# Patient Record
Sex: Male | Born: 1990 | Race: Black or African American | Hispanic: No | Marital: Single | Smoking: Never smoker
Health system: Southern US, Community
[De-identification: ages and names within clinical notes are randomized; demographics above are authoritative.]

## PROBLEM LIST (undated history)

## (undated) HISTORY — PX: TESTICLE SURGERY: SHX794

---

## 2015-06-30 ENCOUNTER — Inpatient Hospital Stay (HOSPITAL_COMMUNITY)
Admission: EM | Admit: 2015-06-30 | Discharge: 2015-07-04 | DRG: 885 | Disposition: A | Discharge status: Other Acute Inpatient Hospital | Payer: PRIVATE HEALTH INSURANCE | Attending: Internal Medicine | Admitting: Internal Medicine

## 2015-06-30 ENCOUNTER — Encounter (HOSPITAL_COMMUNITY): Payer: Self-pay

## 2015-06-30 DIAGNOSIS — Y92009 Unspecified place in unspecified non-institutional (private) residence as the place of occurrence of the external cause: Secondary | ICD-10-CM

## 2015-06-30 DIAGNOSIS — T50902A Poisoning by unspecified drugs, medicaments and biological substances, intentional self-harm, initial encounter: Secondary | ICD-10-CM

## 2015-06-30 DIAGNOSIS — R4589 Other symptoms and signs involving emotional state: Secondary | ICD-10-CM

## 2015-06-30 DIAGNOSIS — T50901A Poisoning by unspecified drugs, medicaments and biological substances, accidental (unintentional), initial encounter: Secondary | ICD-10-CM | POA: Diagnosis present

## 2015-06-30 DIAGNOSIS — T391X2A Poisoning by 4-Aminophenol derivatives, intentional self-harm, initial encounter: Secondary | ICD-10-CM | POA: Diagnosis present

## 2015-06-30 DIAGNOSIS — F322 Major depressive disorder, single episode, severe without psychotic features: Secondary | ICD-10-CM | POA: Diagnosis not present

## 2015-06-30 DIAGNOSIS — R410 Disorientation, unspecified: Secondary | ICD-10-CM

## 2015-06-30 DIAGNOSIS — R4689 Other symptoms and signs involving appearance and behavior: Secondary | ICD-10-CM

## 2015-06-30 LAB — CBC
HCT: 42.7 % (ref 39.0–52.0)
Hemoglobin: 13.8 g/dL (ref 13.0–17.0)
MCH: 23.1 pg — AB (ref 26.0–34.0)
MCHC: 32.3 g/dL (ref 30.0–36.0)
MCV: 71.4 fL — AB (ref 78.0–100.0)
PLATELETS: 268 10*3/uL (ref 150–400)
RBC: 5.98 MIL/uL — ABNORMAL HIGH (ref 4.22–5.81)
RDW: 14.9 % (ref 11.5–15.5)
WBC: 9.2 10*3/uL (ref 4.0–10.5)

## 2015-06-30 NOTE — ED Notes (Signed)
Pt here for overdose on dayquil, tylenol, and some sleeping aid pills today around 0800 to hurt himself. He states he thinks he took about 100 pills today. Pt has vomited multiple times today. Pt is A&Ox4, ambulatory. Pt brought in by his significant other.

## 2015-07-01 ENCOUNTER — Encounter (HOSPITAL_COMMUNITY): Payer: Self-pay | Admitting: *Deleted

## 2015-07-01 DIAGNOSIS — T450X2A Poisoning by antiallergic and antiemetic drugs, intentional self-harm, initial encounter: Secondary | ICD-10-CM

## 2015-07-01 DIAGNOSIS — T50901A Poisoning by unspecified drugs, medicaments and biological substances, accidental (unintentional), initial encounter: Secondary | ICD-10-CM | POA: Diagnosis present

## 2015-07-01 DIAGNOSIS — T484X2A Poisoning by expectorants, intentional self-harm, initial encounter: Secondary | ICD-10-CM | POA: Diagnosis not present

## 2015-07-01 DIAGNOSIS — F489 Nonpsychotic mental disorder, unspecified: Secondary | ICD-10-CM | POA: Diagnosis not present

## 2015-07-01 DIAGNOSIS — T50992A Poisoning by other drugs, medicaments and biological substances, intentional self-harm, initial encounter: Secondary | ICD-10-CM | POA: Diagnosis not present

## 2015-07-01 DIAGNOSIS — R4589 Other symptoms and signs involving emotional state: Secondary | ICD-10-CM

## 2015-07-01 DIAGNOSIS — F322 Major depressive disorder, single episode, severe without psychotic features: Secondary | ICD-10-CM | POA: Diagnosis present

## 2015-07-01 DIAGNOSIS — Y92009 Unspecified place in unspecified non-institutional (private) residence as the place of occurrence of the external cause: Secondary | ICD-10-CM | POA: Diagnosis not present

## 2015-07-01 DIAGNOSIS — T391X2A Poisoning by 4-Aminophenol derivatives, intentional self-harm, initial encounter: Secondary | ICD-10-CM | POA: Diagnosis present

## 2015-07-01 DIAGNOSIS — F332 Major depressive disorder, recurrent severe without psychotic features: Secondary | ICD-10-CM | POA: Diagnosis not present

## 2015-07-01 DIAGNOSIS — R4689 Other symptoms and signs involving appearance and behavior: Secondary | ICD-10-CM

## 2015-07-01 DIAGNOSIS — T50902A Poisoning by unspecified drugs, medicaments and biological substances, intentional self-harm, initial encounter: Secondary | ICD-10-CM | POA: Diagnosis present

## 2015-07-01 LAB — COMPREHENSIVE METABOLIC PANEL
ALBUMIN: 4.4 g/dL (ref 3.5–5.0)
ALK PHOS: 63 U/L (ref 38–126)
ALT: 22 U/L (ref 17–63)
ALT: 22 U/L (ref 17–63)
AST: 16 U/L (ref 15–41)
AST: 16 U/L (ref 15–41)
Albumin: 3.9 g/dL (ref 3.5–5.0)
Alkaline Phosphatase: 54 U/L (ref 38–126)
Anion gap: 9 (ref 5–15)
Anion gap: 10 (ref 5–15)
BILIRUBIN TOTAL: 0.3 mg/dL (ref 0.3–1.2)
BUN: 8 mg/dL (ref 6–20)
BUN: 7 mg/dL (ref 6–20)
CALCIUM: 9.1 mg/dL (ref 8.9–10.3)
CHLORIDE: 103 mmol/L (ref 101–111)
CO2: 27 mmol/L (ref 22–32)
CO2: 25 mmol/L (ref 22–32)
CREATININE: 1.17 mg/dL (ref 0.61–1.24)
CREATININE: 1.23 mg/dL (ref 0.61–1.24)
Calcium: 8.8 mg/dL — ABNORMAL LOW (ref 8.9–10.3)
Chloride: 103 mmol/L (ref 101–111)
GFR calc Af Amer: >60 mL/min (ref >60)
GFR calc Af Amer: >60 mL/min (ref >60)
GFR calc non Af Amer: >60 mL/min (ref >60)
GLUCOSE: 103 mg/dL — AB (ref 65–99)
Glucose, Bld: 121 mg/dL — ABNORMAL HIGH (ref 65–99)
Potassium: 3.7 mmol/L (ref 3.5–5.1)
Potassium: 4.1 mmol/L (ref 3.5–5.1)
SODIUM: 139 mmol/L (ref 135–145)
Sodium: 138 mmol/L (ref 135–145)
Total Bilirubin: 0.2 mg/dL — ABNORMAL LOW (ref 0.3–1.2)
Total Protein: 8.6 g/dL — ABNORMAL HIGH (ref 6.5–8.1)
Total Protein: 7.5 g/dL (ref 6.5–8.1)

## 2015-07-01 LAB — CBG MONITORING, ED: GLUCOSE-CAPILLARY: 100 mg/dL — AB (ref 65–99)

## 2015-07-01 LAB — MAGNESIUM: Magnesium: 1.9 mg/dL (ref 1.7–2.4)

## 2015-07-01 LAB — ACETAMINOPHEN LEVEL
Acetaminophen (Tylenol), Serum: 56 ug/mL — ABNORMAL HIGH (ref 10–30)
Acetaminophen (Tylenol), Serum: 22 ug/mL (ref 10–30)

## 2015-07-01 LAB — RAPID URINE DRUG SCREEN, HOSP PERFORMED
AMPHETAMINES: NOT DETECTED
BARBITURATES: NOT DETECTED
BENZODIAZEPINES: NOT DETECTED
Cocaine: NOT DETECTED
Opiates: NOT DETECTED
Tetrahydrocannabinol: NOT DETECTED

## 2015-07-01 LAB — PROTIME-INR
INR: 1.25 (ref 0.00–1.49)
Prothrombin Time: 15.9 seconds — ABNORMAL HIGH (ref 11.6–15.2)

## 2015-07-01 LAB — APTT: APTT: 29 s (ref 24–37)

## 2015-07-01 LAB — ETHANOL: Alcohol, Ethyl (B): <5 mg/dL (ref <5)

## 2015-07-01 LAB — SALICYLATE LEVEL: Salicylate Lvl: <4 mg/dL (ref 2.8–30.0)

## 2015-07-01 MED ORDER — DEXTROSE 5 % IV SOLN
15.0000 mg/kg/h | INTRAVENOUS | Status: DC
  Administered 2015-07-01: 15 mg/kg/h via INTRAVENOUS
  Filled 2015-07-01 (×3): qty 200

## 2015-07-01 MED ORDER — ONDANSETRON HCL 4 MG/2ML IJ SOLN: 4.0000 mg | Freq: Four times a day (QID) | INTRAMUSCULAR | Status: DC | PRN

## 2015-07-01 MED ORDER — DEXTROSE-NACL 5-0.45 % IV SOLN
INTRAVENOUS | Status: DC
  Administered 2015-07-01 – 2015-07-02 (×3): via INTRAVENOUS

## 2015-07-01 MED ORDER — LORAZEPAM 2 MG/ML IJ SOLN: 1.0000 mg | Freq: Four times a day (QID) | INTRAMUSCULAR | Status: DC | PRN

## 2015-07-01 MED ORDER — ENOXAPARIN SODIUM 40 MG/0.4ML ~~LOC~~ SOLN
40.0000 mg | SUBCUTANEOUS | Status: DC
  Administered 2015-07-01 – 2015-07-04 (×4): 40 mg via SUBCUTANEOUS
  Filled 2015-07-01 (×4): qty 0.4

## 2015-07-01 MED ORDER — PANTOPRAZOLE SODIUM 40 MG IV SOLR
40.0000 mg | INTRAVENOUS | Status: DC
Start: 2015-07-01 — End: 2015-07-02
  Administered 2015-07-01 – 2015-07-02 (×2): 40 mg via INTRAVENOUS
  Filled 2015-07-01 (×2): qty 40

## 2015-07-01 MED ORDER — ACETYLCYSTEINE LOAD VIA INFUSION
150.0000 mg/kg | Freq: Once | INTRAVENOUS | Status: AC
  Administered 2015-07-01: 19725 mg via INTRAVENOUS
  Filled 2015-07-01: qty 494

## 2015-07-01 NOTE — ED Notes (Signed)
Pt refusing to provide urine sample, states he does not have to go.

## 2015-07-01 NOTE — ED Notes (Signed)
RN spoke with Lawson Fiscal from [facility] regarding pt's ingestion. Pt probably risk for continued toxicity/overdose s/s. Currently recommending tylenol tx, oral or IV - IV dosage /kg. Also recommending mag level. States monitor for anticholinergic s/s, tachycardia, hallucinations, delirium, seizures, QRS widening from sleep aid.

## 2015-07-01 NOTE — Progress Notes (Signed)
MEDICATION RELATED CONSULT NOTE - INITIAL   Pharmacy Consult for IV Acetylcysteine  Indication: Tylenol Overdose   Allergies  Allergen Reactions  . Apple Other (See Comments)    Bubbly lips  . Carrot [Daucus Carota] Other (See Comments)    Bubbly lips   Patient Measurements: Height: 6' (182.9 cm) Weight: 290 lb (131.543 kg) IBW/kg (Calculated) : 77.6  Vital Signs: Temp: 98.8 F (37.1 C) (09/02 2309) Temp Source: Oral (09/02 2309) BP: 148/79 mmHg (09/03 0102) Pulse Rate: 76 (09/03 0102)  Labs:  Recent Labs  06/30/15 2329  WBC 9.2  HGB 13.8  HCT 42.7  PLT 268  CREATININE 1.17  ALBUMIN 4.4  PROT 8.6*  AST 16  ALT 22  ALKPHOS 63  BILITOT 0.2*   Estimated Creatinine Clearance: 136.6 mL/min (by C-G formula based on Cr of 1.17).   Assessment: 24 y/o M here for tylenol ingestion, APAP level is 56, AST/ALT currently WNL, no coag's drawn yet, other labs as above. Poison control has recommended acetylcysteine.   Plan:  Acetylcysteine 150 mg/kg IV load, followed by infusion at 15 mg/kg/hr F/U labs (BMET, INR, urine tox, APAP levels)  Abrea Henle 07/01/2015,1:11 AM

## 2015-07-01 NOTE — H&P (Signed)
History and Physical  Andrew Mcknight ZOX:[id number] DOB: 01-11-1991 DOA: 06/30/2015  PCP: No primary care provider on file.   Chief Complaint: Drug overdose   History of Present Illness:  Patient is a 24 year old male with no significant past medical history who was brought by his significant other for a suicidal attempt at 8 am yesterday morning by ingesting over a 100 tablets of acetaminophen, Nyquil and "sleeping aid" pills. He said he went to sleep immediately after that and slept till 5 pm then woke up and vomited several times. He had LLQ pain as well but denied diarrhea, fever or chills. He denied dyspnea or chest pain. No tremor or confusion. He has mild headache. He has been under stress due to his relationship.   Review of Systems:  CONSTITUTIONAL:  No night sweats.  No fatigue, malaise, lethargy.  No fever or chills. Eyes:  No visual changes.  No eye pain.  No eye discharge.   ENT:    No epistaxis.  No sinus pain.  No sore throat.  No ear pain.  No congestion. RESPIRATORY:  No cough.  No wheeze.  No hemoptysis.  No shortness of breath. CARDIOVASCULAR:  No chest pains.  No palpitations. GASTROINTESTINAL:  abdominal pain.  nausea or vomiting.  No diarrhea or constipation.  No hematemesis.  No hematochezia.  No melena. GENITOURINARY:  No urgency.  No frequency.  No dysuria.  No hematuria.  No obstructive symptoms.  No discharge.  No pain.  No significant abnormal bleeding. MUSCULOSKELETAL:  No musculoskeletal pain.  No joint swelling.  No arthritis. NEUROLOGICAL:  No confusion.  No weakness. headache. No seizure. PSYCHIATRIC:  No depression. No anxiety. No suicidal ideation. SKIN:  No rashes.  No lesions.  No wounds. ENDOCRINE:  No unexplained weight loss.  No polydipsia.  No polyuria.  No polyphagia. HEMATOLOGIC:  No anemia.  No purpura.  No petechiae.  No bleeding.  ALLERGIC AND IMMUNOLOGIC:  No pruritus.  No swelling Other:  Past Medical and Surgical History:   History  reviewed. No pertinent past medical history. Past Surgical History  Procedure Laterality Date  . Testicle surgery      Social History:   reports that he has never smoked. He does not have any smokeless tobacco history on file. He reports that he does not drink alcohol. His drug history is not on file.   Allergies  Allergen Reactions  . Apple Other (See Comments)    Bubbly lips  . Carrot [Daucus Carota] Other (See Comments)    Bubbly lips    FH:  No FHx of depression.   Prior to Admission medications   Not on File    Physical Exam: BP 152/88 mmHg  Pulse 81  Temp(Src) 98.8 F (37.1 C) (Oral)  Resp 16  Ht 6' (1.829 m)  Wt 131.543 kg (290 lb)  BMI 39.32 kg/m2  SpO2 97%  GENERAL : Well developed, well nourished, alert and cooperative, and appears to be in no acute distress. HEAD: normocephalic. EYES: PERRL, EOMI.vision is grossly intact. EARS: External auditory canals and tympanic membranes clear, hearing grossly intact. NOSE: No nasal discharge. THROAT: Oral cavity and pharynx normal. No inflammation, swelling, exudate, or lesions.  NECK: Neck supple, non-tendery. CARDIAC: Normal S1 and S2. No S3, S4 or murmurs. Rhythm is regular. There is no peripheral edema, cyanosis or pallor. Extremities are warm and well perfused. No carotid bruits. LUNGS: Clear to auscultation and percussion without rales, rhonchi, wheezing or diminished breath sounds. ABDOMEN: Positive  bowel sounds. Soft, nondistended, nontender. No guarding or rebound. No masses. MUSKULOSKELETAL: Adequately aligned spine. ROM intact spine and extremities. No joint erythema or tenderness. Normal muscular development. Normal gait. EXTREMITIES: No significant deformity or joint abnormality. No edema. Peripheral pulses intact. No varicosities. NEUROLOGICAL: The mental examination revealed the patient was oriented to person, place, and time.CN II-XII intact. Strength and sensation symmetric and intact throughout.    SKIN: Skin normal color, texture and turgor with no lesions or eruptions. PSYCHIATRIC:  Depressed mood.          Labs on Admission:  Reviewed.   Radiological Exams on Admission: No results found.  EKG:  Independently reviewed. Sinus.   Assessment/Plan  Acetaminophen overdose:  Level today about 12 hours after ingestion 56 Poison control called by ER: started on N acetylcysteine  Continue to monitor LFTs and Creatinine periodically   Unknown drug overdose: sleeping pills Significant other will bring the bottle name Will keep on seizure precautions in case it was a BZ No symptoms suggestive of anti H or anti C effect : in case it was Benadryl  EKG sinus ryhtm: will repeat in am  Depression with suicidal attempt:  consult to psych Keep a bed sitter NPO for now Not suicidal now  DVT prophylaxis: Enoxaparin  GI prophylaxis: PPI Consultants: Pysch Code Status: Full Family Communication: significant other at bedside  Disposition Plan: admit    Eston Esters M.D [facility]

## 2015-07-01 NOTE — ED Notes (Signed)
Attempted report x1. 

## 2015-07-01 NOTE — ED Provider Notes (Signed)
CSN: [id number]     Arrival date & time 06/30/15  2249 History   First MD Initiated Contact with Patient 06/30/15 2322     Chief Complaint  Patient presents with  . Drug Overdose  . Suicidal     (Consider location/radiation/quality/duration/timing/severity/associated sxs/prior Treatment) HPI Andrew Mcknight is a 24 yo male who presents today after an overdose. The patient states he took many Tylenol, sleep aides, and Dayquil this morning around 08:00. He went back to sleep and woke up around 17:00 and began vomiting. He was not able to specify how many. He states he has felt down and depressed for the past couple of months. He has not been able to get work. He states he feels bad because he has not been able to provide for his baby or his significant other.  He admits to abdominal pain in the LLQ. He denies trying to commit suicide in the past. His significant other was in the room and says that he has mentioned killing himself many times during arguments but never thought he would actually go through with it.   History reviewed. No pertinent past medical history. Past Surgical History  Procedure Laterality Date  . Testicle surgery     No family history on file. Social History  Substance Use Topics  . Smoking status: Never Smoker   . Smokeless tobacco: None  . Alcohol Use: No    Review of Systems All other systems negative except as documented in the HPI. All pertinent positives and negatives as reviewed in the HPI.  Allergies  Apple and Carrot  Home Medications   Prior to Admission medications   Not on File   BP 148/83 mmHg  Pulse 83  Temp(Src) 98.8 F (37.1 C) (Oral)  Resp 18  Ht 6' (1.829 m)  Wt 290 lb (131.543 kg)  BMI 39.32 kg/m2  SpO2 98% Physical Exam  Constitutional: He is oriented to person, place, and time. He appears well-developed and well-nourished.  Patient is lethargic and unable to fully open his eyes on exam.   HENT:  Head: Normocephalic and atraumatic.   Mouth/Throat: Oropharynx is clear and moist.  Eyes: EOM are normal. Right pupil is not reactive.  Pupils are fixed and dilated.   Neck: Normal range of motion. Neck supple.  Cardiovascular: Normal rate, regular rhythm and normal heart sounds.  Exam reveals no gallop and no friction rub.   No murmur heard. Pulmonary/Chest: Effort normal and breath sounds normal. No respiratory distress.  Abdominal: Soft. Bowel sounds are normal. He exhibits no distension. There is tenderness.  Tenderness to palpation in LLQ.   Neurological: He is alert and oriented to person, place, and time.  Skin: Skin is warm and dry. No erythema.  Psychiatric: He has a normal mood and affect. His behavior is normal. He expresses suicidal ideation.  Nursing note and vitals reviewed.   ED Course  Procedures (including critical care time) Labs Review Labs Reviewed  COMPREHENSIVE METABOLIC PANEL - Abnormal; Notable for the following:    Glucose, Bld 103 (*)    Total Protein 8.6 (*)    Total Bilirubin 0.2 (*)    All other components within normal limits  ACETAMINOPHEN LEVEL - Abnormal; Notable for the following:    Acetaminophen (Tylenol), Serum 56 (*)    All other components within normal limits  CBC - Abnormal; Notable for the following:    RBC 5.98 (*)    MCV 71.4 (*)    MCH 23.1 (*)  All other components within normal limits  CBG MONITORING, ED - Abnormal; Notable for the following:    Glucose-Capillary 100 (*)    All other components within normal limits  ETHANOL  SALICYLATE LEVEL  URINE RAPID DRUG SCREEN, HOSP PERFORMED  MAGNESIUM  ACETAMINOPHEN LEVEL    Imaging Review No results found. I have personally reviewed and evaluated these images and lab results as part of my medical decision-making.   EKG Interpretation None       The patient has an elevated acetaminophen level after 17 hours postingestion.  The nomogram suggests treatment along with poison control.  I spoke with the [facility] and they will come to admit the patient down to admit the patient.  Patient will still need psychiatric evaluation  CRITICAL CARE Performed by: Carlyle Dolly Total critical care time: 40 minutes Critical care time was exclusive of separately billable procedures and treating other patients. Critical care was necessary to treat or prevent imminent or life-threatening deterioration. Critical care was time spent personally by me on the following activities: development of treatment plan with patient and/or surrogate as well as nursing, discussions with consultants, evaluation of patient's response to treatment, examination of patient, obtaining history from patient or surrogate, ordering and performing treatments and interventions, ordering and review of laboratory studies, ordering and review of radiographic studies, pulse oximetry and re-evaluation of patient's condition.;  Patient was given Acetadote and monitored very closely.  Consultation with poison control was initiated.  Spoke with the [facility] about the patient's condition   Charlestine Night, PA-C 07/01/15 0112  Bethann Berkshire, MD 07/01/15 [phone]

## 2015-07-01 NOTE — ED Notes (Signed)
Admitting MD at bedside.

## 2015-07-01 NOTE — Progress Notes (Signed)
PATIENT DETAILS Name: Andrew Mcknight Age: 24 y.o. Sex: male Date of Birth: 1991-08-11 Admit Date: 06/30/2015 Admitting Physician Eston Esters, MD PCP:No primary care provider on file.  Subjective: Denies any abdominal pain  Assessment/Plan: Principal Problem: Drug overdose/suicidal attempt: With Tylenol overdose, remains on Mucomyst infusion-repeat LFTs and Tylenol in am. Await further recommendations from poison control. Seen by psychiatry, recommendations are to transfer to inpatient psych when medically stable.  Disposition: Remain inpatient-inpatient psychiatry in the next few days  Antimicrobial agents  See below  Anti-infectives    None      DVT Prophylaxis: Prophylactic Lovenox   Code Status: Full code   Family Communication None at bedside  Procedures: None  CONSULTS:  psychiatry  Time spent 20 minutes-Greater than 50% of this time was spent in counseling, explanation of diagnosis, planning of further management, and coordination of care.  MEDICATIONS: Scheduled Meds: . enoxaparin (LOVENOX) injection  40 mg Subcutaneous Q24H  . pantoprazole (PROTONIX) IV  40 mg Intravenous Q24H   Continuous Infusions: . acetylcysteine 15 mg/kg/hr (07/01/15 0230)  . dextrose 5 % and 0.45% NaCl 75 mL/hr at 07/01/15 0531   PRN Meds:.ondansetron (ZOFRAN) IV    PHYSICAL EXAM: Vital signs in last 24 hours: Filed Vitals:   07/01/15 0230 07/01/15 0300 07/01/15 0332 07/01/15 0530  BP: 149/74 149/81 139/77 153/76  Pulse: 92 88 87 77  Temp:   98.4 F (36.9 C) 98.6 F (37 C)  TempSrc:   Oral Oral  Resp: 18 18 18 18   Height:   6' (1.829 m)   Weight:   123.56 kg (272 lb 6.4 oz)   SpO2: 100% 100% 100% 100%    Weight change:  Filed Weights   06/30/15 2309 07/01/15 0332  Weight: 131.543 kg (290 lb) 123.56 kg (272 lb 6.4 oz)   Body mass index is 36.94 kg/(m^2).   Gen Exam: Awake and alert with clear speech.  Neck: Supple, No JVD.  Chest: B/L Clear.    CVS: S1 S2 Regular, no murmurs.  Abdomen: soft, BS +, non tender, non distended.  Extremities: no edema, lower extremities warm to touch. Neurologic: Non Focal.   Skin: No Rash.  Wounds: N/A.    Intake/Output from previous day: No intake or output data in the 24 hours ending 07/01/15 1322   LAB RESULTS: CBC  Recent Labs Lab 06/30/15 2329  WBC 9.2  HGB 13.8  HCT 42.7  PLT 268  MCV 71.4*  MCH 23.1*  MCHC 32.3  RDW 14.9    Chemistries   Recent Labs Lab 06/30/15 2329 07/01/15 0500  NA 139 138  K 3.7 4.1  CL 103 103  CO2 27 25  GLUCOSE 103* 121*  BUN 8 7  CREATININE 1.17 1.23  CALCIUM 9.1 8.8*  MG  --  1.9    CBG:  Recent Labs Lab 07/01/15 0007  GLUCAP 100*    GFR Estimated Creatinine Clearance: 125.7 mL/min (by C-G formula based on Cr of 1.23).  Coagulation profile  Recent Labs Lab 07/01/15 0500  INR 1.25    Cardiac Enzymes No results for input(s): CKMB, TROPONINI, MYOGLOBIN in the last 168 hours.  Invalid input(s): CK  Invalid input(s): POCBNP No results for input(s): DDIMER in the last 72 hours. No results for input(s): HGBA1C in the last 72 hours. No results for input(s): CHOL, HDL, LDLCALC, TRIG, CHOLHDL, LDLDIRECT in the last 72 hours. No results for input(s):  TSH, T4TOTAL, T3FREE, THYROIDAB in the last 72 hours.  Invalid input(s): FREET3 No results for input(s): VITAMINB12, FOLATE, FERRITIN, TIBC, IRON, RETICCTPCT in the last 72 hours. No results for input(s): LIPASE, AMYLASE in the last 72 hours.  Urine Studies No results for input(s): UHGB, CRYS in the last 72 hours.  Invalid input(s): UACOL, UAPR, USPG, UPH, UTP, UGL, UKET, UBIL, UNIT, UROB, ULEU, UEPI, UWBC, URBC, UBAC, CAST, UCOM, BILUA  MICROBIOLOGY: No results found for this or any previous visit (from the past 240 hour(s)).  RADIOLOGY STUDIES/RESULTS: No results found.  Jeoffrey Massed, MD  [facility] Pager:[phone]  If 7PM-7AM, please contact  night-coverage [email or web address] Password [email or web address] 07/01/2015, 1:22 PM

## 2015-07-01 NOTE — Consult Note (Signed)
[facility] Consult   Reason for Consult:  Suicide attempt, depression Referring Physician:  Ghimire Patient Identification: Andrew Mcknight MRN:  [id number] Principal Diagnosis: Major depressive disorder, single episode, severe Diagnosis:   Patient Active Problem List   Diagnosis Date Noted  . Overdose [T50.901A] 07/01/2015    Priority: High  . Suicidal behavior [F48.9] 07/01/2015    Priority: High  . Major depressive disorder, single episode, severe [F32.2] 07/01/2015    Priority: High    Total Time spent with patient: 1 hour  Subjective:   Andrew Mcknight is a 24 y.o. male patient admitted due to suicide attempt by overdose.  HPI: Thanks for asking me to do a psychiatric consultation on Andrew Mcknight, a 24 year old male who denies prior history of mental illness. He was brought to the ED earlier today after he told his girlfriend that he overdosed on a bunch of pills, about 60-100 tablets of Tylenol, sleep aides, Nyquill and Dayquil. Patient reports that he has been stressed out, overwhelmed and depressed for more than 3 months after he lost his job. He reports feeling hopeless, helpless, worthless, and thinks he is a failure. Current stressors include money problem due to being unemployed, self perception of not being a good father to his son and  recurrent arguments with his girlfriend of 3 years who is the mother of his son, he acknowledged being disrespectful to her, calling her names whenever he is upset . Patient denies psychosis, delusional thinking, drug or alcohol abuse. Patient denies prior history of suicide attempts but his girlfriend says that he has mentioned killing himself many times during arguments but never thought he would actually go through with it.   HPI Elements:   Location:  depression, suicide attempt. Quality:  severe. Duration:  3 months. Context:  money problem, unemployed.  Past Medical History: History reviewed. No pertinent past medical history.   Past Surgical History  Procedure Laterality Date  . Testicle surgery     Family History: History reviewed. No pertinent family history. Social History:  History  Alcohol Use No     History  Drug Use Not on file    Social History   Social History  . Marital Status: Single    Spouse Name: N/A  . Number of Children: N/A  . Years of Education: N/A   Social History Main Topics  . Smoking status: Never Smoker   . Smokeless tobacco: None  . Alcohol Use: No  . Drug Use: None  . Sexual Activity: Not Asked   Other Topics Concern  . None   Social History Narrative   Additional Social History:                          Allergies:   Allergies  Allergen Reactions  . Apple Other (See Comments)    Bubbly lips  . Carrot [Daucus Carota] Other (See Comments)    Bubbly lips    Labs:  Results for orders placed or performed during the hospital encounter of 06/30/15 (from the past 48 hour(s))  Comprehensive metabolic panel     Status: Abnormal   Collection Time: 06/30/15 11:29 PM  Result Value Ref Range   Sodium 139 135 - 145 mmol/L   Potassium 3.7 3.5 - 5.1 mmol/L   Chloride 103 101 - 111 mmol/L   CO2 27 22 - 32 mmol/L   Glucose, Bld 103 (H) 65 - 99 mg/dL   BUN 8 6 -  20 mg/dL   Creatinine, Ser 1.17 0.61 - 1.24 mg/dL   Calcium 9.1 8.9 - 10.3 mg/dL   Total Protein 8.6 (H) 6.5 - 8.1 g/dL   Albumin 4.4 3.5 - 5.0 g/dL   AST 16 15 - 41 U/L   ALT 22 17 - 63 U/L   Alkaline Phosphatase 63 38 - 126 U/L   Total Bilirubin 0.2 (L) 0.3 - 1.2 mg/dL   GFR calc non Af Amer >60 >60 mL/min   GFR calc Af Amer >60 >60 mL/min    Comment: (NOTE) The eGFR has been calculated using the CKD EPI equation. This calculation has not been validated in all clinical situations. eGFR's persistently <60 mL/min signify possible Chronic Kidney Disease.    Anion gap 9 5 - 15  Ethanol (ETOH)     Status: None   Collection Time: 06/30/15 11:29 PM  Result Value Ref Range   Alcohol, Ethyl (B) <5  <5 mg/dL    Comment:        LOWEST DETECTABLE LIMIT FOR SERUM ALCOHOL IS 5 mg/dL FOR MEDICAL PURPOSES ONLY   Salicylate level     Status: None   Collection Time: 06/30/15 11:29 PM  Result Value Ref Range   Salicylate Lvl <6.6 2.8 - 30.0 mg/dL  Acetaminophen level     Status: Abnormal   Collection Time: 06/30/15 11:29 PM  Result Value Ref Range   Acetaminophen (Tylenol), Serum 56 (H) 10 - 30 ug/mL    Comment:        THERAPEUTIC CONCENTRATIONS VARY SIGNIFICANTLY. A RANGE OF 10-30 ug/mL MAY BE AN EFFECTIVE CONCENTRATION FOR MANY PATIENTS. HOWEVER, SOME ARE BEST TREATED AT CONCENTRATIONS OUTSIDE THIS RANGE. ACETAMINOPHEN CONCENTRATIONS >150 ug/mL AT 4 HOURS AFTER INGESTION AND >50 ug/mL AT 12 HOURS AFTER INGESTION ARE OFTEN ASSOCIATED WITH TOXIC REACTIONS.   CBC     Status: Abnormal   Collection Time: 06/30/15 11:29 PM  Result Value Ref Range   WBC 9.2 4.0 - 10.5 K/uL   RBC 5.98 (H) 4.22 - 5.81 MIL/uL   Hemoglobin 13.8 13.0 - 17.0 g/dL   HCT 42.7 39.0 - 52.0 %   MCV 71.4 (L) 78.0 - 100.0 fL   MCH 23.1 (L) 26.0 - 34.0 pg   MCHC 32.3 30.0 - 36.0 g/dL   RDW 14.9 11.5 - 15.5 %   Platelets 268 150 - 400 K/uL  CBG monitoring, ED     Status: Abnormal   Collection Time: 07/01/15 12:07 AM  Result Value Ref Range   Glucose-Capillary 100 (H) 65 - 99 mg/dL  Urine rapid drug screen (hosp performed) (Not at [facility])     Status: None   Collection Time: 07/01/15  2:49 AM  Result Value Ref Range   Opiates NONE DETECTED NONE DETECTED   Cocaine NONE DETECTED NONE DETECTED   Benzodiazepines NONE DETECTED NONE DETECTED   Amphetamines NONE DETECTED NONE DETECTED   Tetrahydrocannabinol NONE DETECTED NONE DETECTED   Barbiturates NONE DETECTED NONE DETECTED    Comment:        DRUG SCREEN FOR MEDICAL PURPOSES ONLY.  IF CONFIRMATION IS NEEDED FOR ANY PURPOSE, NOTIFY LAB WITHIN 5 DAYS.        LOWEST DETECTABLE LIMITS FOR URINE DRUG SCREEN Drug Class       Cutoff (ng/mL) Amphetamine       1000 Barbiturate      200 Benzodiazepine   294 Tricyclics       765 Opiates  300 Cocaine          300 THC              50   Magnesium     Status: None   Collection Time: 07/01/15  5:00 AM  Result Value Ref Range   Magnesium 1.9 1.7 - 2.4 mg/dL  Acetaminophen level     Status: None   Collection Time: 07/01/15  5:00 AM  Result Value Ref Range   Acetaminophen (Tylenol), Serum 22 10 - 30 ug/mL    Comment:        THERAPEUTIC CONCENTRATIONS VARY SIGNIFICANTLY. A RANGE OF 10-30 ug/mL MAY BE AN EFFECTIVE CONCENTRATION FOR MANY PATIENTS. HOWEVER, SOME ARE BEST TREATED AT CONCENTRATIONS OUTSIDE THIS RANGE. ACETAMINOPHEN CONCENTRATIONS >150 ug/mL AT 4 HOURS AFTER INGESTION AND >50 ug/mL AT 12 HOURS AFTER INGESTION ARE OFTEN ASSOCIATED WITH TOXIC REACTIONS.   Protime-INR     Status: Abnormal   Collection Time: 07/01/15  5:00 AM  Result Value Ref Range   Prothrombin Time 15.9 (H) 11.6 - 15.2 seconds   INR 1.25 0.00 - 1.49  Comprehensive metabolic panel     Status: Abnormal   Collection Time: 07/01/15  5:00 AM  Result Value Ref Range   Sodium 138 135 - 145 mmol/L   Potassium 4.1 3.5 - 5.1 mmol/L   Chloride 103 101 - 111 mmol/L   CO2 25 22 - 32 mmol/L   Glucose, Bld 121 (H) 65 - 99 mg/dL   BUN 7 6 - 20 mg/dL   Creatinine, Ser 1.23 0.61 - 1.24 mg/dL   Calcium 8.8 (L) 8.9 - 10.3 mg/dL   Total Protein 7.5 6.5 - 8.1 g/dL   Albumin 3.9 3.5 - 5.0 g/dL   AST 16 15 - 41 U/L   ALT 22 17 - 63 U/L   Alkaline Phosphatase 54 38 - 126 U/L   Total Bilirubin 0.3 0.3 - 1.2 mg/dL   GFR calc non Af Amer >60 >60 mL/min   GFR calc Af Amer >60 >60 mL/min    Comment: (NOTE) The eGFR has been calculated using the CKD EPI equation. This calculation has not been validated in all clinical situations. eGFR's persistently <60 mL/min signify possible Chronic Kidney Disease.    Anion gap 10 5 - 15  APTT     Status: None   Collection Time: 07/01/15  5:00 AM  Result Value Ref Range   aPTT 29  24 - 37 seconds    Vitals: Blood pressure 153/76, pulse 77, temperature 98.6 F (37 C), temperature source Oral, resp. rate 18, height 6' (1.829 m), weight 123.56 kg (272 lb 6.4 oz), SpO2 100 %.  Risk to Self: Is patient at risk for suicide?: No Risk to Others:   Prior Inpatient Therapy:   Prior Outpatient Therapy:    Current Facility-Administered Medications  Medication Dose Route Frequency Provider Last Rate Last Dose  . acetylcysteine (ACETADOTE) 40,000 mg in dextrose 5 % 1,000 mL (40 mg/mL) infusion  15 mg/kg/hr Intravenous Continuous Christopher Lawyer, PA-C 49.3 mL/hr at 07/01/15 0230 15 mg/kg/hr at 07/01/15 0230  . dextrose 5 %-0.45 % sodium chloride infusion   Intravenous Continuous Gennaro Africa, MD 75 mL/hr at 07/01/15 0531    . enoxaparin (LOVENOX) injection 40 mg  40 mg Subcutaneous Q24H Gennaro Africa, MD   40 mg at 07/01/15 1011  . ondansetron (ZOFRAN) injection 4 mg  4 mg Intravenous Q6H PRN Gennaro Africa, MD      . pantoprazole (PROTONIX) injection  40 mg  40 mg Intravenous Q24H Gennaro Africa, MD   40 mg at 07/01/15 0531    Musculoskeletal: Strength & Muscle Tone: within normal limits Gait & Station: unsteady Patient leans: Front  Psychiatric Specialty Exam: Physical Exam  Psychiatric: His affect is blunt. His speech is delayed. He is slowed and withdrawn. Cognition and memory are normal. He expresses impulsivity. He exhibits a depressed mood. He expresses suicidal ideation.    Review of Systems  Constitutional: Positive for malaise/fatigue.  HENT: Negative.   Eyes: Negative.   Respiratory: Negative.   Cardiovascular: Negative.   Gastrointestinal: Positive for nausea.  Genitourinary: Negative.   Musculoskeletal: Positive for myalgias.  Skin: Negative.   Neurological: Positive for weakness.  Endo/Heme/Allergies: Negative.   Psychiatric/Behavioral: Positive for depression and suicidal ideas.    Blood pressure 153/76, pulse 77, temperature 98.6 F (37 C), temperature  source Oral, resp. rate 18, height 6' (1.829 m), weight 123.56 kg (272 lb 6.4 oz), SpO2 100 %.Body mass index is 36.94 kg/(m^2).  General Appearance: Casual and dressed in hospital gown  Eye Contact::  Minimal  Speech:  Slow and Slurred  Volume:  Decreased  Mood:  Depressed, Dysphoric and Hopeless  Affect:  Constricted  Thought Process:  Goal Directed  Orientation:  Full (Time, Place, and Person)  Thought Content:  Negative  Suicidal Thoughts:  Yes.  without intent/plan  Homicidal Thoughts:  No  Memory:  Immediate;   Good Recent;   Good Remote;   Good  Judgement:  Impaired  Insight:  Shallow  Psychomotor Activity:  Decreased  Concentration:  Fair  Recall:  [place] of Knowledge:Good  Language: Good  Akathisia:  No  Handed:  Right  AIMS (if indicated):     Assets:  Communication Skills Desire for Improvement Physical Health Social Support  ADL's:  Intact  Cognition: WNL  Sleep:   poor   Medical Decision Making: Review or order clinical lab tests (1), Established Problem, Worsening (2), Review of Medication Regimen & Side Effects (2) and Review of New Medication or Change in Dosage (2)  Treatment Plan Summary: Daily contact with patient to assess and evaluate symptoms and progress in treatment.   Medication management: -Prozac 44m daily for depression when patient is fully awake. -Trazodone 586mQhs as needed for insomnia.  Plan: -  Recommend psychiatric Inpatient admission when medically cleared. -Supportive therapy provided about ongoing stressors. -Monitor Tylenol level and Liver enzyme daily for 72 hrs. -Unit Social worker to assisst in finding psychiatric inpatient hospital for further stabilization when patient is medically cleared. Disposition: as above.  AkCorena PilgrimMD 07/01/2015 12:37 PM

## 2015-07-01 NOTE — ED Notes (Signed)
Updated Lawson Fiscal at [employer].

## 2015-07-02 LAB — ACETAMINOPHEN LEVEL: Acetaminophen (Tylenol), Serum: <10 ug/mL — ABNORMAL LOW (ref 10–30)

## 2015-07-02 LAB — COMPREHENSIVE METABOLIC PANEL
ALBUMIN: 3.3 g/dL — AB (ref 3.5–5.0)
ALK PHOS: 53 U/L (ref 38–126)
ALT: 16 U/L — AB (ref 17–63)
AST: 12 U/L — ABNORMAL LOW (ref 15–41)
Anion gap: 7 (ref 5–15)
BUN: 7 mg/dL (ref 6–20)
CHLORIDE: 103 mmol/L (ref 101–111)
CO2: 28 mmol/L (ref 22–32)
CREATININE: 1.06 mg/dL (ref 0.61–1.24)
Calcium: 8.5 mg/dL — ABNORMAL LOW (ref 8.9–10.3)
GFR calc non Af Amer: >60 mL/min (ref >60)
GLUCOSE: 110 mg/dL — AB (ref 65–99)
Potassium: 3.5 mmol/L (ref 3.5–5.1)
SODIUM: 138 mmol/L (ref 135–145)
Total Bilirubin: 0.2 mg/dL — ABNORMAL LOW (ref 0.3–1.2)
Total Protein: 7.1 g/dL (ref 6.5–8.1)

## 2015-07-02 LAB — PROTIME-INR
INR: 1.28 (ref 0.00–1.49)
PROTHROMBIN TIME: 16.1 s — AB (ref 11.6–15.2)

## 2015-07-02 MED ORDER — DEXTROSE 5 % IV SOLN
15.0000 mg/kg/h | INTRAVENOUS | Status: AC
  Filled 2015-07-02: qty 200

## 2015-07-02 NOTE — Progress Notes (Signed)
  PROGRESS NOTE  Andrew Mcknight ZOX:[id number] DOB: [birth year] DOA: 06/30/2015 PCP: No primary care provider on file.  Summary: 24 year old man no significant PMH presented with a history of intentional ingestion of over 100 tablets of acetaminophen, NyQuil and sleeping aid pills in a suicide attempt. Acetaminophen level was elevated and patient was started on Mucomyst as per poison control with recommendations to monitor LFTs, renal function, signs or symptoms such as tachycardia hallucinations delirium seizures and QRS widening. Evaluated by psychiatry recommendations for Prozac 10 mg daily and trazodone 50 mg as needed for insomnia. Inpatient psychiatric treatment recommended.  Assessment/Plan: 1. Intentional overdose with acetaminophen, other agents, suicide attempt. No apparent sequela. LFTs unremarkable. Will follow poison control recs as below. 2. Major depressive disorder, severe; as per psychiatry.    Discussed with poison control, their recs:  Repeat EKG--if ok, then will be medically clear  Stop Mucomyst after current bag  Code Status: full code DVT prophylaxis: Lovenox Family Communication: none Disposition Plan: inpatient psychiatry when medically stable  Brendia Sacks, MD  [facility]  Pager [phone] If 7PM-7AM, please contact night-coverage at [email or web address], password [email or web address] 07/02/2015, 8:02 AM  LOS: 1 day   Consultants:  Psychiatry  Poison control  Procedures:    Antibiotics:    HPI/Subjective: Feels fine. No n/v/pain. Eating fine. Happy to be alive and talking to a psychiatrist. No SI. Looking forward to more treatment.   Objective: Filed Vitals:   07/01/15 0530 07/01/15 1501 07/01/15 2137 07/02/15 0611  BP: 153/76 148/49 141/38 109/65  Pulse: 77 76 70 56  Temp: 98.6 F (37 C)  99.3 F (37.4 C) 98.1 F (36.7 C)  TempSrc: Oral Oral Oral Oral  Resp: Height:      Weight:      SpO2: 100% 100% 100% 100%    Intake/Output Summary (Last  24 hours) at 07/02/15 0802 Last data filed at 07/02/15 0331  Gross per 24 hour  Intake 2641.97 ml  Output    900 ml  Net 1741.97 ml     Filed Weights   06/30/15 2309 07/01/15 0332  Weight: 131.543 kg (290 lb) 123.56 kg (272 lb 6.4 oz)    Exam:     Afebrile, vital signs are stable, no hypoxia General:  Appears calm and comfortable Cardiovascular: RRR, no m/r/g.  Telemetry: SR, no arrhythmias  Respiratory: CTA bilaterally, no w/r/r. Normal respiratory effort. Abdomen: soft, ntnd Musculoskeletal: grossly normal tone BUE/BLE Psychiatric: grossly normal mood and affect, speech fluent and appropriate  New data reviewed:  Complete metabolic panel unremarkable  Pertinent data since admission:  Initial LFTs were unremarkable  Initial Tylenol level 56 >> 22 >> less than 10  Urine drug screen was negative  Alcohol level negative  EKG sinus rhythm, nonspecific T-wave changes. Normal QTC.  Pending data:    Scheduled Meds: . enoxaparin (LOVENOX) injection  40 mg Subcutaneous Q24H  . pantoprazole (PROTONIX) IV  40 mg Intravenous Q24H   Continuous Infusions: . acetylcysteine    . dextrose 5 % and 0.45% NaCl 75 mL/hr at 07/02/15 0754    Principal Problem:   Major depressive disorder, single episode, severe Active Problems:   Overdose   Suicidal behavior   Time spent 25 minutes

## 2015-07-03 ENCOUNTER — Inpatient Hospital Stay (HOSPITAL_COMMUNITY): Payer: PRIVATE HEALTH INSURANCE

## 2015-07-03 DIAGNOSIS — T1491 Suicide attempt: Secondary | ICD-10-CM

## 2015-07-03 DIAGNOSIS — T50902A Poisoning by unspecified drugs, medicaments and biological substances, intentional self-harm, initial encounter: Secondary | ICD-10-CM

## 2015-07-03 DIAGNOSIS — F489 Nonpsychotic mental disorder, unspecified: Secondary | ICD-10-CM

## 2015-07-03 DIAGNOSIS — T484X2A Poisoning by expectorants, intentional self-harm, initial encounter: Secondary | ICD-10-CM

## 2015-07-03 DIAGNOSIS — R45851 Suicidal ideations: Secondary | ICD-10-CM

## 2015-07-03 MED ORDER — FLUOXETINE HCL 10 MG PO CAPS
10.0000 mg | ORAL_CAPSULE | Freq: Every day | ORAL | Status: DC
  Administered 2015-07-03 – 2015-07-04 (×2): 10 mg via ORAL
  Filled 2015-07-03 (×2): qty 1

## 2015-07-03 NOTE — Consult Note (Signed)
[facility] Consult   Reason for Consult:  Suicide attempt, depression Referring Physician:  Ghimire Patient Identification: Andrew Mcknight MRN:  [id number] Principal Diagnosis: Major depressive disorder, single episode, severe Diagnosis:   Patient Active Problem List   Diagnosis Date Noted  . Overdose [T50.901A] 07/01/2015  . Suicidal behavior [F48.9] 07/01/2015  . Major depressive disorder, single episode, severe [F32.2] 07/01/2015    Total Time spent with patient: 1 hour  Subjective:   Andrew Mcknight is a 24 y.o. male patient admitted due to suicide attempt by overdose.  HPI: Andrew Mcknight is a 24 year old male seen for psychiatric consultation and evaluation of increased symptoms of depression and status post suicidal attempt. Reportedly patient has intentional overdose of and a bunch of pills including Tylenol, sleep aides like NyQuil and DayQuil. Patient has been stressed out, overwhelmed and depressed for more than 3 months after he lost his job. He reports feeling hopeless, helpless, worthless, and thinks he is a failure. Current stressors include financial problem due to being unemployed, self perception of not being a good father to his son and  recurrent arguments with his girlfriend of 3 years who is the mother of his son, he acknowledged being disrespectful to her, calling her names whenever he is upset . Patient denies psychosis, delusional thinking, drug or alcohol abuse. Patient denies prior history of suicide attempts but his girlfriend says that he has mentioned killing himself many times during arguments but never thought he would actually go through with it. Patient has no known history of acute psychiatric hospitalization or outpatient medication management.  HPI Elements:  Location:  depression, suicide attempt. Quality:  severe. Duration:  3 months. Context:  money problem, unemployed.  Past Medical History: History reviewed. No pertinent past medical  history.  Past Surgical History  Procedure Laterality Date  . Testicle surgery     Family History: History reviewed. No pertinent family history. Social History:  History  Alcohol Use No     History  Drug Use Not on file    Social History   Social History  . Marital Status: Single    Spouse Name: N/A  . Number of Children: N/A  . Years of Education: N/A   Social History Main Topics  . Smoking status: Never Smoker   . Smokeless tobacco: None  . Alcohol Use: No  . Drug Use: None  . Sexual Activity: Not Asked   Other Topics Concern  . None   Social History Narrative   Additional Social History:                          Allergies:   Allergies  Allergen Reactions  . Apple Other (See Comments)    Bubbly lips  . Carrot [Daucus Carota] Other (See Comments)    Bubbly lips    Labs:  Results for orders placed or performed during the hospital encounter of 06/30/15 (from the past 48 hour(s))  Acetaminophen level     Status: Abnormal   Collection Time: 07/02/15  4:20 AM  Result Value Ref Range   Acetaminophen (Tylenol), Serum <10 (L) 10 - 30 ug/mL    Comment:        THERAPEUTIC CONCENTRATIONS VARY SIGNIFICANTLY. A RANGE OF 10-30 ug/mL MAY BE AN EFFECTIVE CONCENTRATION FOR MANY PATIENTS. HOWEVER, SOME ARE BEST TREATED AT CONCENTRATIONS OUTSIDE THIS RANGE. ACETAMINOPHEN CONCENTRATIONS >150 ug/mL AT 4 HOURS AFTER INGESTION AND >50 ug/mL AT 12 HOURS AFTER INGESTION ARE  OFTEN ASSOCIATED WITH TOXIC REACTIONS.   Comprehensive metabolic panel     Status: Abnormal   Collection Time: 07/02/15  4:21 AM  Result Value Ref Range   Sodium 138 135 - 145 mmol/L   Potassium 3.5 3.5 - 5.1 mmol/L   Chloride 103 101 - 111 mmol/L   CO2 28 22 - 32 mmol/L   Glucose, Bld 110 (H) 65 - 99 mg/dL   BUN 7 6 - 20 mg/dL   Creatinine, Ser 1.06 0.61 - 1.24 mg/dL   Calcium 8.5 (L) 8.9 - 10.3 mg/dL   Total Protein 7.1 6.5 - 8.1 g/dL   Albumin 3.3 (L) 3.5 - 5.0 g/dL   AST 12 (L)  15 - 41 U/L   ALT 16 (L) 17 - 63 U/L   Alkaline Phosphatase 53 38 - 126 U/L   Total Bilirubin 0.2 (L) 0.3 - 1.2 mg/dL   GFR calc non Af Amer >60 >60 mL/min   GFR calc Af Amer >60 >60 mL/min    Comment: (NOTE) The eGFR has been calculated using the CKD EPI equation. This calculation has not been validated in all clinical situations. eGFR's persistently <60 mL/min signify possible Chronic Kidney Disease.    Anion gap 7 5 - 15  Protime-INR     Status: Abnormal   Collection Time: 07/02/15  8:46 AM  Result Value Ref Range   Prothrombin Time 16.1 (H) 11.6 - 15.2 seconds   INR 1.28 0.00 - 1.49    Vitals: Blood pressure 116/41, pulse 56, temperature 98.4 F (36.9 C), temperature source Oral, resp. rate 13, height 6' (1.829 m), weight 123.56 kg (272 lb 6.4 oz), SpO2 99 %.  Risk to Self: Is patient at risk for suicide?: No Risk to Others:   Prior Inpatient Therapy:   Prior Outpatient Therapy:    Current Facility-Administered Medications  Medication Dose Route Frequency Provider Last Rate Last Dose  . enoxaparin (LOVENOX) injection 40 mg  40 mg Subcutaneous Q24H Gennaro Africa, MD   40 mg at 07/03/15 0940  . FLUoxetine (PROZAC) capsule 10 mg  10 mg Oral Daily Kinnie Feil, MD      . LORazepam (ATIVAN) injection 1 mg  1 mg Intravenous Q6H PRN Shanker Kristeen Mans, MD        Musculoskeletal: Strength & Muscle Tone: within normal limits Gait & Station: unsteady Patient leans: Front  Psychiatric Specialty Exam: Physical Exam  Psychiatric: His affect is blunt. His speech is delayed. He is slowed and withdrawn. Cognition and memory are normal. He expresses impulsivity. He exhibits a depressed mood. He expresses suicidal ideation.    Review of Systems  Constitutional: Positive for malaise/fatigue.  HENT: Negative.   Eyes: Negative.   Respiratory: Negative.   Cardiovascular: Negative.   Gastrointestinal: Positive for nausea.  Genitourinary: Negative.   Musculoskeletal: Positive for  myalgias.  Skin: Negative.   Neurological: Positive for weakness.  Endo/Heme/Allergies: Negative.   Psychiatric/Behavioral: Positive for depression and suicidal ideas.    Blood pressure 116/41, pulse 56, temperature 98.4 F (36.9 C), temperature source Oral, resp. rate 13, height 6' (1.829 m), weight 123.56 kg (272 lb 6.4 oz), SpO2 99 %.Body mass index is 36.94 kg/(m^2).  General Appearance: Casual and dressed in hospital gown  Eye Contact::  Minimal  Speech:  Slow and Slurred  Volume:  Decreased  Mood:  Depressed, Dysphoric and Hopeless  Affect:  Constricted  Thought Process:  Goal Directed  Orientation:  Full (Time, Place, and Person)  Thought Content:  Negative  Suicidal Thoughts:  Yes.  without intent/plan  Homicidal Thoughts:  No  Memory:  Immediate;   Good Recent;   Good Remote;   Good  Judgement:  Impaired  Insight:  Shallow  Psychomotor Activity:  Decreased  Concentration:  Fair  Recall:  [place] of Knowledge:Good  Language: Good  Akathisia:  No  Handed:  Right  AIMS (if indicated):     Assets:  Communication Skills Desire for Improvement Physical Health Social Support  ADL's:  Intact  Cognition: WNL  Sleep:   poor   Medical Decision Making: Review or order clinical lab tests (1), Established Problem, Worsening (2), Review of Medication Regimen & Side Effects (2) and Review of New Medication or Change in Dosage (2)  Treatment Plan Summary: Daily contact with patient to assess and evaluate symptoms and progress in treatment.  Medication management: Continue Prozac 10mg  daily for depression when patient is fully awake. Continue Trazodone 50mg  Qhs as needed for insomnia.  Plan: Fluoxetine 10 mg PO QAm for depression Case discussed with the psychiatric social service Psychiatric Inpatient admission when medically cleared. Supportive therapy provided about ongoing stressors. Monitor Tylenol level and Liver enzyme daily for 72 hrs. Appreciate psychiatric  consultation and follow up as clinically required Please contact [phone] or [phone] if needs further assistance   Durward Parcel., MD 07/03/2015 11:58 AM

## 2015-07-03 NOTE — Progress Notes (Signed)
[facility] PROGRESS NOTE  Andrew Mcknight ZOX:[id number] DOB: 1991-06-26 DOA: 06/30/2015 PCP: No primary care provider on file.  Assessment/Plan: 24 year old man no significant PMH presented with a history of intentional ingestion of over 100 tablets of acetaminophen, NyQuil and sleeping aid pills in a suicide attempt. Acetaminophen level was elevated and patient was started on Mucomyst as per poison control with recommendations to monitor LFTs, renal function, signs or symptoms such as tachycardia hallucinations delirium seizures. Evaluated by psychiatry recommendations for Prozac 10 mg daily and trazodone 50 mg as needed for insomnia. Inpatient psychiatric treatment recommended.  1. Intentional overdose with acetaminophen, other agents, suicide attempt. No apparent sequela. LFTs unremarkable. Completed mucomyst  2. Major depressive disorder, severe; as per psychiatry. Patient and family reports intermittent memory issues, confusion. Requested CT head   Patient is medically stable for inpatient psychiatry admission, transfer    Code Status: full Family Communication: d/w patient, his mother  (indicate person spoken with, relationship, and if by phone, the number) Disposition Plan: inpatient psychiatry    Consultants:  Psychiatry  Poison control     Procedures:  none  Antibiotics:  none (indicate start date, and stop date if known)  HPI/Subjective: Alert. No pains   Objective: Filed Vitals:   07/03/15 0543  BP: 116/41  Pulse: 56  Temp:   Resp:     Intake/Output Summary (Last 24 hours) at 07/03/15 1152 Last data filed at 07/02/15 1359  Gross per 24 hour  Intake    460 ml  Output    400 ml  Net     60 ml   Filed Weights   06/30/15 2309 07/01/15 0332  Weight: 131.543 kg (290 lb) 123.56 kg (272 lb 6.4 oz)    Exam:   General:  No distress   Cardiovascular: s1,s2 rrr  Respiratory: CTA BL  Abdomen: soft, nt,nd   Musculoskeletal: no leg edema   Data  Reviewed: Basic Metabolic Panel:  Recent Labs Lab 06/30/15 2329 07/01/15 0500 07/02/15 0421  NA 139 138 138  K 3.7 4.1 3.5  CL 103 103 103  CO2 GLUCOSE 103* 121* 110*  BUN CREATININE 1.17 1.23 1.06  CALCIUM 9.1 8.8* 8.5*  MG  --  1.9  --    Liver Function Tests:  Recent Labs Lab 06/30/15 2329 07/01/15 0500 07/02/15 0421  AST 16 16 12*  ALT 22 22 16*  ALKPHOS 63 54 53  BILITOT 0.2* 0.3 0.2*  PROT 8.6* 7.5 7.1  ALBUMIN 4.4 3.9 3.3*   No results for input(s): LIPASE, AMYLASE in the last 168 hours. No results for input(s): AMMONIA in the last 168 hours. CBC:  Recent Labs Lab 06/30/15 2329  WBC 9.2  HGB 13.8  HCT 42.7  MCV 71.4*  PLT 268   Cardiac Enzymes: No results for input(s): CKTOTAL, CKMB, CKMBINDEX, TROPONINI in the last 168 hours. BNP (last 3 results) No results for input(s): BNP in the last 8760 hours.  ProBNP (last 3 results) No results for input(s): PROBNP in the last 8760 hours.  CBG:  Recent Labs Lab 07/01/15 0007  GLUCAP 100*    No results found for this or any previous visit (from the past 240 hour(s)).   Studies: No results found.  Scheduled Meds: . enoxaparin (LOVENOX) injection  40 mg Subcutaneous Q24H   Continuous Infusions:   Principal Problem:   Major depressive disorder, single episode, severe Active Problems:   Overdose   Suicidal behavior  Time spent: >35 minutes     Esperanza Sheets  [facility] Pager [phone]. If 7PM-7AM, please contact night-coverage at [email or web address], password [facility] 07/03/2015, 11:52 AM  LOS: 2 days

## 2015-07-03 NOTE — Care Management Note (Signed)
Case Management Note  Patient Details  Name: Yashar Inclan MRN: [id number] Date of Birth: 1991-08-09  Subjective/Objective:                 Patient admitted with intentional overdose/ suicide attempt. Patient states he recently moved from [place] to be here with his girlfriend and their 29 month old baby. He states he lives with the girlfriend. Patient being evaluated by psych, SW consult placed by CM for placement per MD note. Patient's mother at bedside who came down from [place] (she states she is a Engineer, civil (consulting) at [employer]) to be with patient while he is in hospital. Other than current acute psychiatric episode, patient states that he drives, and does not have medication needs, he has [facility] that he states is [facility]. CM gave patient Health connect number to assist in finding a PCP.    Action/Plan:  Will continue to follow and offer assistance as needed.  Expected Discharge Date:                  Expected Discharge Plan:  Psychiatric Hospital  In-House Referral:  Clinical Social Work  Discharge planning Services  CM Consult  Post Acute Care Choice:    Choice offered to:     DME Arranged:    DME Agency:     HH Arranged:    HH Agency:     Status of Service:  In process, will continue to follow  Medicare Important Message Given:    Date Medicare IM Given:    Medicare IM give by:    Date Additional Medicare IM Given:    Additional Medicare Important Message give by:     If discussed at Long Length of Stay Meetings, dates discussed:    Additional Comments:  Lawerance Sabal, RN 07/03/2015, 2:01 PM

## 2015-07-03 NOTE — Progress Notes (Signed)
   07/03/15 1210  Clinical Encounter Type  Visited With Patient and family together;Health care provider  Visit Type Initial  Referral From Nurse   Chaplain followed up with a consult, and patient denied chaplain support at this time. Chaplain available as needed. Pager - [phone]  Alda Ponder, Chaplain 07/03/2015 12:37 PM

## 2015-07-04 ENCOUNTER — Encounter (HOSPITAL_COMMUNITY): Payer: Self-pay | Admitting: *Deleted

## 2015-07-04 ENCOUNTER — Inpatient Hospital Stay (HOSPITAL_COMMUNITY)
Admission: AD | Admit: 2015-07-04 | Discharge: 2015-07-10 | DRG: 885 | Disposition: A | Discharge status: Home / Self Care | Payer: PRIVATE HEALTH INSURANCE | Source: Intra-hospital | Attending: Psychiatry | Admitting: Psychiatry

## 2015-07-04 DIAGNOSIS — T50901A Poisoning by unspecified drugs, medicaments and biological substances, accidental (unintentional), initial encounter: Secondary | ICD-10-CM

## 2015-07-04 DIAGNOSIS — F339 Major depressive disorder, recurrent, unspecified: Secondary | ICD-10-CM | POA: Diagnosis present

## 2015-07-04 DIAGNOSIS — Z599 Problem related to housing and economic circumstances, unspecified: Secondary | ICD-10-CM | POA: Diagnosis not present

## 2015-07-04 DIAGNOSIS — F322 Major depressive disorder, single episode, severe without psychotic features: Secondary | ICD-10-CM | POA: Diagnosis present

## 2015-07-04 DIAGNOSIS — F489 Nonpsychotic mental disorder, unspecified: Secondary | ICD-10-CM | POA: Diagnosis not present

## 2015-07-04 DIAGNOSIS — F332 Major depressive disorder, recurrent severe without psychotic features: Secondary | ICD-10-CM

## 2015-07-04 LAB — GLUCOSE, CAPILLARY: Glucose-Capillary: 96 mg/dL (ref 65–99)

## 2015-07-04 MED ORDER — HYDROXYZINE HCL 25 MG PO TABS: 25.0000 mg | ORAL_TABLET | ORAL | Status: DC | PRN

## 2015-07-04 MED ORDER — MAGNESIUM HYDROXIDE 400 MG/5ML PO SUSP: 30.0000 mL | Freq: Every day | ORAL | Status: DC | PRN

## 2015-07-04 MED ORDER — ACETAMINOPHEN 325 MG PO TABS: 650.0000 mg | ORAL_TABLET | Freq: Four times a day (QID) | ORAL | Status: DC | PRN

## 2015-07-04 MED ORDER — NICOTINE 21 MG/24HR TD PT24
21.0000 mg | MEDICATED_PATCH | Freq: Every day | TRANSDERMAL | Status: DC
  Filled 2015-07-04: qty 1

## 2015-07-04 MED ORDER — TRAZODONE HCL 50 MG PO TABS
50.0000 mg | ORAL_TABLET | Freq: Every day | ORAL | Status: DC
  Filled 2015-07-04 (×4): qty 1

## 2015-07-04 MED ORDER — FLUOXETINE HCL 10 MG PO CAPS: 10.0000 mg | ORAL_CAPSULE | Freq: Every day | ORAL | Status: DC

## 2015-07-04 MED ORDER — ALUM & MAG HYDROXIDE-SIMETH 200-200-20 MG/5ML PO SUSP: 30.0000 mL | ORAL | Status: DC | PRN

## 2015-07-04 NOTE — Discharge Summary (Signed)
Physician Discharge Summary  Andrew Mcknight RUE:[id number] DOB: 1991-04-20 DOA: 06/30/2015  PCP: No primary care provider on file.  Admit date: 06/30/2015 Discharge date: 07/04/2015  Time spent: >35 minutes  Recommendations for Outpatient Follow-up:  Inpatient psychiatry   Discharge Diagnoses:  Principal Problem:   Major depressive disorder, single episode, severe Active Problems:   Overdose   Suicidal behavior   Discharge Condition: stable   Diet recommendation: regular   Filed Weights   06/30/15 2309 07/01/15 0332  Weight: 131.543 kg (290 lb) 123.56 kg (272 lb 6.4 oz)    History of present illness:  24 year old man no significant PMH presented with a history of intentional ingestion of over 100 tablets of acetaminophen, NyQuil and sleeping aid pills in a suicide attempt. Acetaminophen level was elevated and patient was started on Mucomyst as per poison control with recommendations to monitor LFTs, renal function, signs or symptoms such as tachycardia hallucinations delirium seizures. Evaluated by psychiatry recommendations for Prozac 10 mg daily and trazodone 50 mg as needed for insomnia. Inpatient psychiatric treatment recommended.   Hospital Course:  1. Intentional overdose with acetaminophen, other agents, suicide attempt. No apparent sequela. LFTs unremarkable. Completed mucomyst  2. Major depressive disorder, severe; as per psychiatry. Started prozac. CT head: no acute findings   Patient is being discharged to inpatient psychiatry for further management    Procedures:  CT head (i.e. Studies not automatically included, echos, thoracentesis, etc; not x-rays)  Consultations:  Psychiatry  Poison  control     Discharge Exam: Filed Vitals:   07/04/15 0544  BP: 116/54  Pulse: 60  Temp: 97.9 F (36.6 C)  Resp: 17    General: alert. No distress  Cardiovascular: s1,s2 rrr Respiratory: CTA BL  Discharge Instructions  Discharge Instructions    Diet - low sodium  heart healthy    Complete by:  As directed      Increase activity slowly    Complete by:  As directed             Medication List    TAKE these medications        FLUoxetine 10 MG capsule  Commonly known as:  PROZAC  Take 1 capsule (10 mg total) by mouth daily.       Allergies  Allergen Reactions  . Apple Other (See Comments)    Bubbly lips  . Carrot [Daucus Carota] Other (See Comments)    Bubbly lips       Follow-up Information    Follow up with [facility] EMERGENCY DEPARTMENT.   Specialty:  Emergency Medicine   Contact information:   [address] [id number] mc [place] [address] [phone]       The results of significant diagnostics from this hospitalization (including imaging, microbiology, ancillary and laboratory) are listed below for reference.    Significant Diagnostic Studies: Ct Head Wo Contrast  07/03/2015   CLINICAL DATA:  Acetaminophen overdose and confusion.  EXAM: CT HEAD WITHOUT CONTRAST  TECHNIQUE: Contiguous axial images were obtained from the base of the skull through the vertex without intravenous contrast.  COMPARISON:  None.  FINDINGS: No intracranial abnormalities are identified, including mass lesion or mass effect, hydrocephalus, extra-axial fluid collection, midline shift, hemorrhage, or acute infarction.  The visualized bony calvarium is unremarkable.  IMPRESSION: Normal noncontrast head CT.   Electronically Signed   By: Harmon Pier M.D.   On: 07/03/2015 14:38    Microbiology: No results found for this or any previous visit (from  the past 240 hour(s)).   Labs: Basic Metabolic Panel:  Recent Labs Lab 06/30/15 2329 07/01/15 0500 07/02/15 0421  NA 139 138 138  K 3.7 4.1 3.5  CL 103 103 103  CO2 27 25 28   GLUCOSE 103* 121* 110*  BUN 8 7 7   CREATININE 1.17 1.23 1.06  CALCIUM 9.1 8.8* 8.5*  MG  --  1.9  --    Liver Function Tests:  Recent Labs Lab 06/30/15 2329 07/01/15 0500  07/02/15 0421  AST 16 16 12*  ALT 22 22 16*  ALKPHOS 63 54 53  BILITOT 0.2* 0.3 0.2*  PROT 8.6* 7.5 7.1  ALBUMIN 4.4 3.9 3.3*   No results for input(s): LIPASE, AMYLASE in the last 168 hours. No results for input(s): AMMONIA in the last 168 hours. CBC:  Recent Labs Lab 06/30/15 2329  WBC 9.2  HGB 13.8  HCT 42.7  MCV 71.4*  PLT 268   Cardiac Enzymes: No results for input(s): CKTOTAL, CKMB, CKMBINDEX, TROPONINI in the last 168 hours. BNP: BNP (last 3 results) No results for input(s): BNP in the last 8760 hours.  ProBNP (last 3 results) No results for input(s): PROBNP in the last 8760 hours.  CBG:  Recent Labs Lab 07/01/15 0007 07/04/15 0823  GLUCAP 100* 96       Signed:  Kassandra Meriweather N  [facility] 07/04/2015, 1:34 PM

## 2015-07-04 NOTE — Care Management Note (Signed)
Case Management Note  Patient Details  Name: Andrew Mcknight MRN: [id number] Date of Birth: 07/05/1991  Subjective/Objective:       Patient will be dc to inpatient psych at dc.  Almira Coaster, Psych CSW following.             Action/Plan:   Expected Discharge Date:                  Expected Discharge Plan:  Psychiatric Hospital  In-House Referral:  Clinical Social Work  Discharge planning Services  CM Consult  Post Acute Care Choice:    Choice offered to:     DME Arranged:    DME Agency:     HH Arranged:    HH Agency:     Status of Service:  Completed, signed off  Medicare Important Message Given:    Date Medicare IM Given:    Medicare IM give by:    Date Additional Medicare IM Given:    Additional Medicare Important Message give by:     If discussed at Long Length of Stay Meetings, dates discussed:    Additional Comments:  Leone Haven, RN 07/04/2015, 12:49 PM

## 2015-07-04 NOTE — Tx Team (Signed)
Initial Interdisciplinary Treatment Plan   PATIENT STRESSORS: Financial difficulties Marital or family conflict Occupational concerns   PATIENT STRENGTHS: Average or above average intelligence Capable of independent living Communication skills Physical Health   PROBLEM LIST: Problem List/Patient Goals Date to be addressed Date deferred Reason deferred Estimated date of resolution  Overdose due to SI 07/04/2015     Financial difficulty 07/04/2015     Depression 07/04/2015     Difficult adjusting to new area 07/04/2015     "I know I need some help.  I've been depressed all summer." 07/04/2015                              DISCHARGE CRITERIA:  Improved stabilization in mood, thinking, and/or behavior Motivation to continue treatment in a less acute level of care Need for constant or close observation no longer present Verbal commitment to aftercare and medication compliance  PRELIMINARY DISCHARGE PLAN: Outpatient therapy Return to previous living arrangement  PATIENT/FAMIILY INVOLVEMENT: This treatment plan has been presented to and reviewed with the patient, Andrew Mcknight.  The patient and family have been given the opportunity to ask questions and make suggestions.  Cranford Mon 07/04/2015, 4:21 PM

## 2015-07-04 NOTE — Progress Notes (Signed)
[facility] PROGRESS NOTE  Andrew Mcknight ZOX:[id number] DOB: 09-25-1991 DOA: 06/30/2015 PCP: No primary care provider on file.  Assessment/Plan: 24 year old man no significant PMH presented with a history of intentional ingestion of over 100 tablets of acetaminophen, NyQuil and sleeping aid pills in a suicide attempt. Acetaminophen level was elevated and patient was started on Mucomyst as per poison control with recommendations to monitor LFTs, renal function, signs or symptoms such as tachycardia hallucinations delirium seizures. Evaluated by psychiatry recommendations for Prozac 10 mg daily and trazodone 50 mg as needed for insomnia. Inpatient psychiatric treatment recommended.  1. Intentional overdose with acetaminophen, other agents, suicide attempt. No apparent sequela. LFTs unremarkable. Completed mucomyst  2. Major depressive disorder, severe; as per psychiatry. Started prozac. CT head: no acute findings    Patient is medically stable for inpatient psychiatry admission, transfer    Code Status: full Family Communication: d/w patient, his mother  (indicate person spoken with, relationship, and if by phone, the number) Disposition Plan: inpatient psychiatry when bed is available    Consultants:  Psychiatry  Poison control     Procedures:  none  Antibiotics:  none (indicate start date, and stop date if known)  HPI/Subjective: Alert. No pains   Objective: Filed Vitals:   07/04/15 0544  BP: 116/54  Pulse: 60  Temp: 97.9 F (36.6 C)  Resp: 17    Intake/Output Summary (Last 24 hours) at 07/04/15 1047 Last data filed at 07/04/15 0956  Gross per 24 hour  Intake    240 ml  Output      0 ml  Net    240 ml   Filed Weights   06/30/15 2309 07/01/15 0332  Weight: 131.543 kg (290 lb) 123.56 kg (272 lb 6.4 oz)    Exam:   General:  No distress   Cardiovascular: s1,s2 rrr  Respiratory: CTA BL  Abdomen: soft, nt,nd   Musculoskeletal: no leg edema   Data  Reviewed: Basic Metabolic Panel:  Recent Labs Lab 06/30/15 2329 07/01/15 0500 07/02/15 0421  NA 139 138 138  K 3.7 4.1 3.5  CL 103 103 103  CO2 27 25 28   GLUCOSE 103* 121* 110*  BUN 8 7 7   CREATININE 1.17 1.23 1.06  CALCIUM 9.1 8.8* 8.5*  MG  --  1.9  --    Liver Function Tests:  Recent Labs Lab 06/30/15 2329 07/01/15 0500 07/02/15 0421  AST 16 16 12*  ALT 22 22 16*  ALKPHOS 63 54 53  BILITOT 0.2* 0.3 0.2*  PROT 8.6* 7.5 7.1  ALBUMIN 4.4 3.9 3.3*   No results for input(s): LIPASE, AMYLASE in the last 168 hours. No results for input(s): AMMONIA in the last 168 hours. CBC:  Recent Labs Lab 06/30/15 2329  WBC 9.2  HGB 13.8  HCT 42.7  MCV 71.4*  PLT 268   Cardiac Enzymes: No results for input(s): CKTOTAL, CKMB, CKMBINDEX, TROPONINI in the last 168 hours. BNP (last 3 results) No results for input(s): BNP in the last 8760 hours.  ProBNP (last 3 results) No results for input(s): PROBNP in the last 8760 hours.  CBG:  Recent Labs Lab 07/01/15 0007 07/04/15 0823  GLUCAP 100* 96    No results found for this or any previous visit (from the past 240 hour(s)).   Studies: Ct Head Wo Contrast  07/03/2015   CLINICAL DATA:  Acetaminophen overdose and confusion.  EXAM: CT HEAD WITHOUT CONTRAST  TECHNIQUE: Contiguous axial images were obtained from the base of the skull  through the vertex without intravenous contrast.  COMPARISON:  None.  FINDINGS: No intracranial abnormalities are identified, including mass lesion or mass effect, hydrocephalus, extra-axial fluid collection, midline shift, hemorrhage, or acute infarction.  The visualized bony calvarium is unremarkable.  IMPRESSION: Normal noncontrast head CT.   Electronically Signed   By: Harmon Pier M.D.   On: 07/03/2015 14:38    Scheduled Meds: . enoxaparin (LOVENOX) injection  40 mg Subcutaneous Q24H  . FLUoxetine  10 mg Oral Daily   Continuous Infusions:   Principal Problem:   Major depressive disorder,  single episode, severe Active Problems:   Overdose   Suicidal behavior    Time spent: >35 minutes     Esperanza Sheets  [facility] Pager [phone]. If 7PM-7AM, please contact night-coverage at [email or web address], password [facility] 07/04/2015, 10:47 AM  LOS: 3 days

## 2015-07-04 NOTE — Progress Notes (Signed)
Admission note:  Patient is a 24 yo male admitted for suicide attempt on 06/30/2015.  Patient was involuntarily committed at [facility] after being admitted for a serious overdose attempt.  Patient took 100 tablets of tylenol, and unknown amount of sleeping pills and drank some nyquil.  Patient was hospitalized and stabilized per poison control recommendations.  Patient has no prior psyche hx and was not on any medications.  Patient denies SI/HI/AVH.  He states he wished he had not done it due to having a 20 mo son.  Patient moved here from [place] this past October and has not been able to find a job.  He lives with his girlfriend and 20 mo son.  He states that he feels he cannot provide for his family and these feelings became overwhelming.  He states increased depression, insomnia and feeling of worthlessness and hopelessness.  Patient denies any pertinent medical hx, however, his BP is running high.  Notified NP for admission orders and oriented patient to room and unit.

## 2015-07-04 NOTE — Progress Notes (Signed)
Patient has been accepted to [facility] Rm 407-1  Report to be called [phone] Bed is ready when nursing on medical unit is agreeable and ready. Patient to transfer by Juel Burrow.  No other needs voiced by patient or mother. Both agreeable to plan and [patient to sign in voluntarily. Voluntary papers signed and faxed.    Deretha Emory, MSW Clinical Social Work: Emergency Room [phone]

## 2015-07-04 NOTE — Progress Notes (Signed)
Patient was discharged to [facility] by MD order; IV DIC; skin intact. Unit was called and report was given to the nurse who is going to receive the patient. Patient with his mom, both agreeable to plan. Patient will be transported by [facility].

## 2015-07-04 NOTE — Progress Notes (Signed)
LCSW covering for Psych SW reviewed MD note and discussed case Patient being recommended for inpatient admission. Referrals completed:    [facility]:       Under review [facility]:   Under Review [facility]:     Under Review [facility]:  Under Review [facility] Under Review  Will follow up with treatment plan once referral has been assessed and ran by MD.    Deretha Emory, MSW Clinical Social Work: Emergency Room [phone]

## 2015-07-04 NOTE — [facility] Group Notes (Signed)
Adult Psychoeducational Group Note  Date:  07/04/2015 Time:  10:43 PM  Group Topic/Focus:  Wrap-Up Group:   The focus of this group is to help patients review their daily goal of treatment and discuss progress on daily workbooks.  Participation Level:  Active  Participation Quality:  Appropriate  Affect:  Anxious  Cognitive:  Appropriate  Insight: Appropriate  Engagement in Group:  Engaged  Modes of Intervention:  Discussion  Additional Comments:  Patient is a new admit.  Patient had a visit with his family and talked about why he is here.  He expressed that his family told him he will get out what he puts in while here.  Patient stated he wants to talk to someone about why he tried to end his life.  Patient is very open to getting back on the right track and dealing with his feelings.  His goal was to be thankful for the things he has.  Caroll Rancher A 07/04/2015, 10:43 PM

## 2015-07-05 DIAGNOSIS — F332 Major depressive disorder, recurrent severe without psychotic features: Principal | ICD-10-CM

## 2015-07-05 LAB — LIPID PANEL
CHOL/HDL RATIO: 3.7 ratio
Cholesterol: 143 mg/dL (ref 0–200)
HDL: 39 mg/dL — AB (ref >40)
LDL CALC: 82 mg/dL (ref 0–99)
TRIGLYCERIDES: 112 mg/dL (ref <150)
VLDL: 22 mg/dL (ref 0–40)

## 2015-07-05 MED ORDER — FLUOXETINE HCL 20 MG PO CAPS
20.0000 mg | ORAL_CAPSULE | Freq: Every day | ORAL | Status: DC
  Administered 2015-07-05 – 2015-07-10 (×6): 20 mg via ORAL
  Filled 2015-07-05 (×10): qty 1

## 2015-07-05 NOTE — Progress Notes (Signed)
Pt is a new admit to the unit late this afternoon.  He reports he is here after an serious overdose.  He says he is grateful that he was not successful in trying to kill himself.  He says he has a supportive family, girlfriend, and a 38 month old son who he loves very much.  He realizes that he does not want his son to grow up fatherless.  He has been observed sitting in the dayroom talking with peers, and also attended evening group.  He was encouraged to make his needs known to staff.  He has been pleasant and cooperative.  Discharge plans are in process.  Support and encouragement offered.  Safety maintained with q15 minute checks.

## 2015-07-05 NOTE — [facility] Group Notes (Signed)
[facility] LCSW Group Therapy 07/05/2015 1:15 PM  Type of Therapy: Group Therapy- Emotion Regulation  Participation Level: Pt came to group but arrived at the end of the session. Participation minimal.   Chad Cordial, LCSWA 07/05/2015 4:24 PM

## 2015-07-05 NOTE — Progress Notes (Signed)
D: Per patient self inventory form pt reports he slept good last night. He reports a good appetite, normal energy level, good concentration. He rates depression 4/10, hopelessness 2/10, anxiety 0/10- all on 0-10 scale, 10 being the worse. He denies SI/HI. Denies AVH. Denies physical pain. He reports his goal for the day "talking to the doctor about why I am here." He reports "speaking to medical personal" will help him meet his goal today. Pt behavior calm and cooperative on approach. Pt reports "I want to move forward from mistake so that this never happens again." Pt reports a strong desire to be a positive role model in his infant child's life. Pleasant on approach, insightful, eager for treatment.   A:Special checks q 15 mins in place for safety. Plan of care discussed in treatment team. Encouragement and support provided.  R:Safety maintained. Will continue to monitor.

## 2015-07-05 NOTE — H&P (Addendum)
Psychiatric Admission Assessment Adult  Patient Identification: Andrew Mcknight MRN:  [id number] Date of Evaluation:  07/05/2015 Chief Complaint:  Major Depressive Disorder, single episode, severe Principal Diagnosis:  " I overdosed "  Diagnosis:   Patient Active Problem List   Diagnosis Date Noted  . Major depressive disorder, recurrent episode [F33.9] 07/04/2015  . Overdose [T50.901A] 07/01/2015  . Suicidal behavior [F48.9] 07/01/2015  . Major depressive disorder, single episode, severe [F32.2] 07/01/2015   History of Present Illness::   24 year old single male. Patient states he was recently admitted to inpatient medical unit following an overdose . He states he has been dealing with significant depression, related to stressors as below . States he has been dealing with significant stressors, related to job, family, financial issues . States that prior to overdose he had argument with GF, and was having difficulty getting to work due to transportation difficulties " after not being able to get a job for months ", leading to fear he would lose his job only a few days after being hired . He states he went to a local pharmacy and bought some " sleeping pills and some Tylenol " and took these in a suicide attempt. States he then went to sleep with the intention of dying, but then woke up, vomited, and told his GF, leading to admission to hospital. Overdose was 4-5 days ago, - he was initially admitted to medical unit .  Transferred to inpatient psychiatric unit once stabilized medically.   Elements:  Recent serious overdose in the context of severe depression, related to chronic stressors, namely related to relationship, employment, financial stressors .  Associated Signs/Symptoms: Depression Symptoms:  depressed mood, anhedonia, suicidal attempt, loss of energy/fatigue, decreased appetite, (Hypo) Manic Symptoms:  Denies  Anxiety Symptoms:  Reports significant anxiety related to stressors as  above, denies panic attacks, denies social anxiety.  Psychotic Symptoms:  denies  PTSD Symptoms: Denies  Total Time spent with patient: 45 minutes   Past Psychiatric History- no prior suicidal attempts or self injurious ideations, no prior history of psychiatric admissions, no history of mania, no history of psychosis,  States he has been feeling irritable due to stress , and states he has gone to anger management classes in the past, but does not endorse any mania or hypomania .  He has never been on psychiatric medications in the past.   Past Medical History: denies medical illness, denies allergies to medications . Does not smoke. Had a testicular surgery for torsion one year ago.  Past Surgical History  Procedure Laterality Date  . Testicle surgery     Family History: parents alive, live together, they live in [place], has two younger brothers, denies history of mental illness in family, no history of suicides in family, denies any history of substance abuse in family.  Social History:  Single, lives with GF, has child ( 40 month old boy) , recent period of unemployment, but got a job earlier this month. States he might have lost this job but is unsure. Denies legal issues. Concerned about finances .  Was in [place] [employer] x 1.5 years, honorable discharge . History  Alcohol Use No     History  Drug Use Not on file    Social History   Social History  . Marital Status: Single    Spouse Name: N/A  . Number of Children: N/A  . Years of Education: N/A   Social History Main Topics  . Smoking status: Never Smoker   .  Smokeless tobacco: None  . Alcohol Use: No  . Drug Use: None  . Sexual Activity: Not Asked   Other Topics Concern  . None   Social History Narrative   Additional Social History:   Musculoskeletal: Strength & Muscle Tone: within normal limits Gait & Station: normal Patient leans: N/A  Psychiatric Specialty Exam: Physical Exam  Review of Systems   Constitutional: Negative.   HENT: Negative.   Respiratory: Negative.   Cardiovascular: Negative.   Gastrointestinal: Negative.   Genitourinary: Negative.   Musculoskeletal: Negative.   Skin: Negative.   Neurological: Negative for seizures.  Endo/Heme/Allergies: Negative.   Psychiatric/Behavioral: Positive for depression and suicidal ideas. The patient is nervous/anxious.   all other systems negative   Blood pressure 136/83, pulse 83, temperature 98.1 F (36.7 C), temperature source Oral, resp. rate 20, height 6' (1.829 m), weight 280 lb (127.007 kg).Body mass index is 37.97 kg/(m^2).  General Appearance: Fairly Groomed  Patent attorney::  Good  Speech:  Normal Rate  Volume:  Normal  Mood:  Depressed  Affect:  Constricted and but reactive   Thought Process:  Linear  Orientation:  Other:   alert and attentive   Thought Content:  Rumination and denies any hallucinations, not internally preoccupied, no delusions  Suicidal Thoughts:  No- at this time denies any suicidal ideations, denies any self injurious ideations  Homicidal Thoughts:  No  Memory:  recent and remote grossly intact   Judgement:  Fair  Insight:  Present  Psychomotor Activity:  Normal  Concentration:  Good  Recall:  Good  Fund of Knowledge:Good  Language: Negative  Akathisia:  Negative  Handed:  Right  AIMS (if indicated):     Assets:  Communication Skills Desire for Improvement Physical Health Resilience  ADL's:   Improved   Cognition: WNL  Sleep:      Risk to Self: Is patient at risk for suicide?: Yes What has been your use of drugs/alcohol within the last 12 months?: Pt denies Risk to Others:   Prior Inpatient Therapy:   Prior Outpatient Therapy:    Alcohol Screening: 1. How often do you have a drink containing alcohol?: Never 9. Have you or someone else been injured as a result of your drinking?: No 10. Has a relative or friend or a doctor or another health worker been concerned about your drinking or  suggested you cut down?: No Alcohol Use Disorder Identification Test Final Score (AUDIT): 0 Brief Intervention: AUDIT score less than 7 or less-screening does not suggest unhealthy drinking-brief intervention not indicated  Allergies:   Allergies  Allergen Reactions  . Apple Other (See Comments)    Bubbly lips  . Carrot [Daucus Carota] Other (See Comments)    Bubbly lips   Lab Results:  Results for orders placed or performed during the hospital encounter of 07/04/15 (from the past 48 hour(s))  Lipid panel, fasting     Status: Abnormal   Collection Time: 07/05/15  6:30 AM  Result Value Ref Range   Cholesterol 143 0 - 200 mg/dL   Triglycerides 161 <096 mg/dL   HDL 39 (L) >04 mg/dL   Total CHOL/HDL Ratio 3.7 RATIO   VLDL 22 0 - 40 mg/dL   LDL Cholesterol 82 0 - 99 mg/dL    Comment:        Total Cholesterol/HDL:CHD Risk Coronary Heart Disease Risk Table                     Men  Women  1/2 Average Risk   3.4   3.3  Average Risk       5.0   4.4  2 X Average Risk   9.6   7.1  3 X Average Risk  23.4   11.0        Use the calculated Patient Ratio above and the CHD Risk Table to determine the patient's CHD Risk.        ATP III CLASSIFICATION (LDL):  <100     mg/dL   Optimal  098-119  mg/dL   Near or Above                    Optimal  130-159  mg/dL   Borderline  147-829  mg/dL   High  >562     mg/dL   Very High Performed at [facility]    Current Medications: Current Facility-Administered Medications  Medication Dose Route Frequency Provider Last Rate Last Dose  . acetaminophen (TYLENOL) tablet 650 mg  650 mg Oral Q6H PRN Sanjuana Kava, NP      . alum & mag hydroxide-simeth (MAALOX/MYLANTA) 200-200-20 MG/5ML suspension 30 mL  30 mL Oral Q4H PRN Sanjuana Kava, NP      . hydrOXYzine (ATARAX/VISTARIL) tablet 25 mg  25 mg Oral Q4H PRN Sanjuana Kava, NP      . magnesium hydroxide (MILK OF MAGNESIA) suspension 30 mL  30 mL Oral Daily PRN Sanjuana Kava, NP      . traZODone  (DESYREL) tablet 50 mg  50 mg Oral QHS Sanjuana Kava, NP   50 mg at 07/04/15 2153   PTA Medications: Prescriptions prior to admission  Medication Sig Dispense Refill Last Dose  . FLUoxetine (PROZAC) 10 MG capsule Take 1 capsule (10 mg total) by mouth daily. 30 capsule 0     Previous Psychotropic Medications:  He has not been on any psychiatric medications in the past, but recently was started on Prozac .  Substance Abuse History in the last 12 months:  Denies any drug or alcohol abuse     Consequences of Substance Abuse: denies   Results for orders placed or performed during the hospital encounter of 07/04/15 (from the past 72 hour(s))  Lipid panel, fasting     Status: Abnormal   Collection Time: 07/05/15  6:30 AM  Result Value Ref Range   Cholesterol 143 0 - 200 mg/dL   Triglycerides 130 <865 mg/dL   HDL 39 (L) >78 mg/dL   Total CHOL/HDL Ratio 3.7 RATIO   VLDL 22 0 - 40 mg/dL   LDL Cholesterol 82 0 - 99 mg/dL    Comment:        Total Cholesterol/HDL:CHD Risk Coronary Heart Disease Risk Table                     Men   Women  1/2 Average Risk   3.4   3.3  Average Risk       5.0   4.4  2 X Average Risk   9.6   7.1  3 X Average Risk  23.4   11.0        Use the calculated Patient Ratio above and the CHD Risk Table to determine the patient's CHD Risk.        ATP III CLASSIFICATION (LDL):  <100     mg/dL   Optimal  469-629  mg/dL   Near or Above  Optimal  130-159  mg/dL   Borderline  454-098  mg/dL   High  >119     mg/dL   Very High Performed at [facility]     Observation Level/Precautions:  15 minute checks  Laboratory:  As needed   Psychotherapy: supportive, milieu  Medications:    Antidepressant medication - Prozac for depression- 20 mgrs QDAY   Consultations:  As needed   Discharge Concerns: stressors   Estimated LOS: 5-6 days   Other:     Psychological Evaluations:  No   Treatment Plan Summary: Daily contact with patient to  assess and evaluate symptoms and progress in treatment, Medication management, Plan inpatient treatment and medications as above   Medical Decision Making:  Review of Psycho-Social Stressors (1), Review or order clinical lab tests (1), Established Problem, Worsening (2) and Review of Medication Regimen & Side Effects (2)  I certify that inpatient services furnished can reasonably be expected to improve the patient's condition.   COBOS, FERNANDO 9/7/20165:01 PM

## 2015-07-05 NOTE — Progress Notes (Signed)
Recreation Therapy Notes  Date: 09.07.2016 Time: 9:30am Location: [address] Group Room   Group Topic: Stress Management  Goal Area(s) Addresses:  Patient will actively participate in stress management techniques presented during session.   Behavioral Response: Did not attend.   Jarin Cornfield L Puja Caffey, LRT/CTRS        Neal Trulson L 07/05/2015 3:00 PM 

## 2015-07-05 NOTE — BHH Suicide Risk Assessment (Signed)
[facility] : [facility] Admission Suicide Risk Assessment   Nursing information obtained from:   patient/chart  Demographic factors:   24 year old single male, has a 63 m old child Current Mental Status:   see below Loss Factors:   financial, employment stressors Historical Factors:   no formal prior psychiatric history Risk Reduction Factors:   sense of responsibility to family Total Time spent with patient: 45 minutes Principal Problem: MDD, single episode, no psychotic symptoms Diagnosis:   Patient Active Problem List   Diagnosis Date Noted  . Major depressive disorder, recurrent episode [F33.9] 07/04/2015  . Overdose [T50.901A] 07/01/2015  . Suicidal behavior [F48.9] 07/01/2015  . Major depressive disorder, single episode, severe [F32.2] 07/01/2015     Continued Clinical Symptoms:  Alcohol Use Disorder Identification Test Final Score (AUDIT): 0 The "Alcohol Use Disorders Identification Test", Guidelines for Use in Primary Care, Second Edition.  World Science writer [facility]). Score between 0-7:  no or low risk or alcohol related problems. Score between 8-15:  moderate risk of alcohol related problems. Score between 16-19:  high risk of alcohol related problems. Score 20 or above:  warrants further diagnostic evaluation for alcohol dependence and treatment.   CLINICAL FACTORS:  24 year old male, has been dealing with significant and chronic stressors, primarily financial, employment stressors and difficulties with GF.  Severe overdose , which led to inpatient medical admission.  At this time partially improved . No SI currently, but still depressed.    Psychiatric Specialty Exam: Physical Exam  ROS  Blood pressure 136/83, pulse 83, temperature 98.1 F (36.7 C), temperature source Oral, resp. rate 20, height 6' (1.829 m), weight 280 lb (127.007 kg).Body mass index is 37.97 kg/(m^2).    See Admit  Note MSE                                                       COGNITIVE FEATURES THAT CONTRIBUTE TO RISK:  Closed-mindedness and Loss of executive function    SUICIDE RISK:   Moderate:  Frequent suicidal ideation with limited intensity, and duration, some specificity in terms of plans, no associated intent, good self-control, limited dysphoria/symptomatology, some risk factors present, and identifiable protective factors, including available and accessible social support.  PLAN OF CARE: Patient will be admitted to inpatient psychiatric unit for stabilization and safety. Will provide and encourage milieu participation. Provide medication management and maked adjustments as needed.  Will follow daily.    Medical Decision Making:  Review of Psycho-Social Stressors (1), Review or order clinical lab tests (1), Established Problem, Worsening (2) and Review of New Medication or Change in Dosage (2)  I certify that inpatient services furnished can reasonably be expected to improve the patient's condition.   Andrew Mcknight 07/05/2015, 5:31 PM
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
## 2015-07-05 NOTE — BHH Group Notes (Signed)
   [facility] LCSW Aftercare Discharge Planning Group Note  07/05/2015  8:45 AM   Participation Quality: Alert, Appropriate and Oriented  Mood/Affect: Appropriate   Depression Rating: 2-3  Anxiety Rating: 0  Thoughts of Suicide: Pt denies SI/HI  Will you contract for safety? Yes  Current AVH: Pt denies  Plan for Discharge/Comments: Pt attended discharge planning group and actively participated in group. CSW provided pt with today's workbook. Patient reports sleeping well last night. He plans to return home at discharge and is considering a referral for outpatient services.   Transportation Means: Pt reports access to transportation  Supports: No supports mentioned at this time  Samuella Bruin, MSW, [employer] Clinical Social Worker [employer] [phone]

## 2015-07-06 NOTE — BHH Group Notes (Signed)
B[facility] LCSW Group Therapy 07/06/2015 1:15 PM Type of Therapy: Group Therapy Participation Level: Active  Participation Quality: Attentive, Sharing and Supportive  Affect: Appropriate  Cognitive: Alert and Oriented  Insight: Developing/Improving and Engaged  Engagement in Therapy: Developing/Improving and Engaged  Modes of Intervention: Activity, Clarification, Confrontation, Discussion, Education, Exploration, Limit-setting, Orientation, Problem-solving, Rapport Building, Reality Testing, Socialization and Support  Summary of Progress/Problems: Patient was attentive and engaged with speaker from [employer]. Patient was attentive to speaker while they shared their story of dealing with mental health and overcoming it. Patient expressed interest in their programs and services and received information on their agency. Patient processed ways they can relate to the speaker.   Yashika Mask, MSW, LCSWA Clinical Social Worker [facility] [phone]   

## 2015-07-06 NOTE — Progress Notes (Signed)
Adult Psychoeducational Group Note  Date:  07/06/2015 Time:  10:30 PM  Group Topic/Focus:  Wrap-Up Group:   The focus of this group is to help patients review their daily goal of treatment and discuss progress on daily workbooks.  Participation Level:  Active  Participation Quality:  Appropriate and Attentive  Affect:  Appropriate  Cognitive:  Appropriate  Insight: Appropriate and Good  Engagement in Group:  Engaged  Modes of Intervention:  Education  Additional Comments:  Patient goal for today is to understand what directions to take when he leave. Patient is looking to move into a new environment where it is more positive.  Patient also came up with ways to cope with stress.   Merlinda Frederick 07/06/2015, 10:30 PM

## 2015-07-06 NOTE — BHH Group Notes (Signed)
B[facility] Group Notes:  (Nursing/MHT/Case Management/Adjunct)  Date:  07/06/2015  Time:  0900  Type of Therapy:  Nurse Education  Participation Level:  Active  Participation Quality:  Appropriate  Affect:  Appropriate  Cognitive:  Appropriate and Oriented  Insight:  Appropriate  Engagement in Group:  Engaged  Modes of Intervention:  Discussion, Education and Support  Summary of Progress/Problems: Andrew Mcknight talked about developing coping skills so that he can deal with stressors. He spoke about working on his relationships and addressing maladaptive patterns.  Maurine Simmering 07/06/2015, 9:40 AM

## 2015-07-06 NOTE — Progress Notes (Signed)
[facility] MD Progress Note  07/06/2015 3:48 PM Andrew Mcknight  MRN:  [id number] Subjective:  Patient reports he is feeling partially better. He denies any ongoing suicidal ideations. He is focusing mostly on relationship issues and is focused on having a  couples meeting with his Girlfriend . He denies any medication side effects. Objective : I have discussed case with treatment team , and have met with patient. No disruptive behaviors on unit, going to some groups. Isolates in his room at times .  At patient's request and in his presence , I have  Talked with  Mother over the phone. Mother corroborates that patient has no formal psychiatric history , but that he has had " anger problems " on and off and that he had a difficult , stormy relationship with his father, which she thinks has contributed to his depression . She emphasizes that she feels some of Andrew Mcknight's depression is related to relationship stress/conflict with GF and  States that " I think she needs therapy herself ". At present , patient is tolerating Prozac well. Denies any lingering SI, contracts for safety.    Diagnosis:   MDD , without psychotic symptoms Patient Active Problem List   Diagnosis Date Noted  . Major depressive disorder, recurrent episode [F33.9] 07/04/2015  . Overdose [T50.901A] 07/01/2015  . Suicidal behavior [F48.9] 07/01/2015  . Major depressive disorder, single episode, severe [F32.2] 07/01/2015   Total Time spent with patient:25 minutes    Past Medical History: History reviewed. No pertinent past medical history.  Past Surgical History  Procedure Laterality Date  . Testicle surgery     Family History: History reviewed. No pertinent family history. Social History:  History  Alcohol Use No     History  Drug Use Not on file    Social History   Social History  . Marital Status: Single    Spouse Name: N/A  . Number of Children: N/A  . Years of Education: N/A   Social History Main Topics  . Smoking  status: Never Smoker   . Smokeless tobacco: None  . Alcohol Use: No  . Drug Use: None  . Sexual Activity: Not Asked   Other Topics Concern  . None   Social History Narrative   Additional History:    Sleep: Good  Appetite:  Good   Assessment:   Musculoskeletal: Strength & Muscle Tone: within normal limits Gait & Station: normal Patient leans: N/A   Psychiatric Specialty Exam: Physical Exam  ROSdenies SOB, denies chest pain, denies abdominal discomfort or vomiting   Blood pressure 145/84, pulse 76, temperature 98.4 F (36.9 C), temperature source Oral, resp. rate 18, height 6' (1.829 m), weight 280 lb (127.007 kg).Body mass index is 37.97 kg/(m^2).  General Appearance: Fairly Groomed  Engineer, water::  Good  Speech:  Normal Rate  Volume:  Normal  Mood:  Depressed- but states he is feeling better than upon admission  Affect:  Constricted  Thought Process:  Linear  Orientation:  Full (Time, Place, and Person)  Thought Content:  denies any hallucinations, no delusions   Suicidal Thoughts:  No- today denies any suicidal or homicidal ideations. Denies any self injurious ideations .  Homicidal Thoughts:  No  Memory:  recent and remote grossly intact   Judgement:  Fair  Insight:  improved   Psychomotor Activity:  Normal  Concentration:  Good  Recall:  Good  Fund of Knowledge:Good  Language: Good  Akathisia:  Negative  Handed:  Right  AIMS (if indicated):  Assets:  Communication Skills Desire for Improvement Resilience  ADL's:  Fair, improving   Cognition: WNL  Sleep:  Number of Hours: 6.75     Current Medications: Current Facility-Administered Medications  Medication Dose Route Frequency Provider Last Rate Last Dose  . acetaminophen (TYLENOL) tablet 650 mg  650 mg Oral Q6H PRN Encarnacion Slates, NP      . alum & mag hydroxide-simeth (MAALOX/MYLANTA) 200-200-20 MG/5ML suspension 30 mL  30 mL Oral Q4H PRN Encarnacion Slates, NP      . FLUoxetine (PROZAC) capsule 20 mg   20 mg Oral Daily Jenne Campus, MD   20 mg at 07/06/15 0747  . hydrOXYzine (ATARAX/VISTARIL) tablet 25 mg  25 mg Oral Q4H PRN Encarnacion Slates, NP      . magnesium hydroxide (MILK OF MAGNESIA) suspension 30 mL  30 mL Oral Daily PRN Encarnacion Slates, NP      . traZODone (DESYREL) tablet 50 mg  50 mg Oral QHS Encarnacion Slates, NP   50 mg at 07/04/15 2153    Lab Results:  Results for orders placed or performed during the hospital encounter of 07/04/15 (from the past 48 hour(s))  Lipid panel, fasting     Status: Abnormal   Collection Time: 07/05/15  6:30 AM  Result Value Ref Range   Cholesterol 143 0 - 200 mg/dL   Triglycerides 112 <150 mg/dL   HDL 39 (L) >40 mg/dL   Total CHOL/HDL Ratio 3.7 RATIO   VLDL 22 0 - 40 mg/dL   LDL Cholesterol 82 0 - 99 mg/dL    Comment:        Total Cholesterol/HDL:CHD Risk Coronary Heart Disease Risk Table                     Men   Women  1/2 Average Risk   3.4   3.3  Average Risk       5.0   4.4  2 X Average Risk   9.6   7.1  3 X Average Risk  23.4   11.0        Use the calculated Patient Ratio above and the CHD Risk Table to determine the patient's CHD Risk.        ATP III CLASSIFICATION (LDL):  <100     mg/dL   Optimal  100-129  mg/dL   Near or Above                    Optimal  130-159  mg/dL   Borderline  160-189  mg/dL   High  >190     mg/dL   Very High Performed at [facility]     Physical Findings: AIMS: Facial and Oral Movements Muscles of Facial Expression: None, normal Lips and Perioral Area: None, normal Jaw: None, normal Tongue: None, normal,Extremity Movements Upper (arms, wrists, hands, fingers): None, normal Lower (legs, knees, ankles, toes): None, normal, Trunk Movements Neck, shoulders, hips: None, normal, Overall Severity Severity of abnormal movements (highest score from questions above): None, normal Incapacitation due to abnormal movements: None, normal Patient's awareness of abnormal movements (rate only patient's  report): No Awareness, Dental Status Current problems with teeth and/or dentures?: No Does patient usually wear dentures?: No  CIWA:    COWS:      Assessment - at this time patient is partially improved compared to admission, although still presents with constricted affect. No SI.  He is tolerating Prozac well .  Patient, and mother with whom I spoke via phone , feel that some of his depression is related to relationship conflict and patient is very interested in having a family meeting.   Treatment Plan Summary: Daily contact with patient to assess and evaluate symptoms and progress in treatment, Medication management, Plan inpatient admission and medications as below  Continue Prozac 20 mgrs QDAY for depression and anxiety Continue Trazodone 50 mgrs QHS PRN for insomnia Tentative family meeting with patient's SO tomorrow .  Medical Decision Making:  Established Problem, Stable/Improving (1), Review of Psycho-Social Stressors (1), Review or order clinical lab tests (1) and Review of Medication Regimen & Side Effects (2)     COBOS, FERNANDO 07/06/2015, 3:48 PM

## 2015-07-06 NOTE — Progress Notes (Signed)
D: Andrew Mcknight has been calm and appropriate on the unit today, compliant with medications and participating in groups. He expresses insight into addressing his issues. He denies SI/HI/AVH and pain. On his self-inventory, he rates depression 3, hopelessness 2, and anxiety zero. He has verbalized few needs although he has been encouraged to do so.  A: Meds given as ordered. Q15 safety checks maintained. Support/encouragement offered. R: Pt remains free from harm and continues with treatment. Will continue to monitor for needs/safety.

## 2015-07-06 NOTE — Progress Notes (Signed)
Pt goal today was to go in detail about why he is here and explain it to the doctor.Pt stated he felt great when he achieved his goal.tomorrow pt wants to work on understanding how to cope with his life around.

## 2015-07-06 NOTE — Progress Notes (Signed)
Pt's family visited tonight and brought him some belongings.  He states he has had a good day and is looking forward to being discharged soon.  He is eager to get back home to his GF and son.  He denies SI/HI/AVH at this time.  He was observed in the dayroom most of the time talking with peers.  Conversation was minimal.  He voices no needs or concerns this evening.  He says he slept well last night and does not think he will need a sleep aid.  Pt was encouraged to make his needs known to staff.  Support and encouragement offered.  Safety maintained with q15 minute checks.

## 2015-07-06 NOTE — Tx Team (Signed)
Interdisciplinary Treatment Plan Update (Adult) Date: 07/06/2015   Time Reviewed: 9:30 AM  Progress in Treatment: Attending groups: Yes Participating in groups: Yes Taking medication as prescribed: Yes Tolerating medication: Yes Family/Significant other contact made: No, CSW assessing for appropriate contacts  Patient understands diagnosis: Yes Discussing patient identified problems/goals with staff: Yes Medical problems stabilized or resolved: Yes Denies suicidal/homicidal ideation: Yes Issues/concerns per patient self-inventory: Yes Other:  New problem(s) identified: N/A  Discharge Plan or Barriers: Home with outpatient    Reason for Continuation of Hospitalization:  Depression Anxiety Medication Stabilization   Comments: N/A  Estimated length of stay: 2-3 days   Patient is a 24 year old [place] American male with a diagnosis of MDD, single episode, severe. Pt was admitted to [facility] after serious overdose on tylenol, sleeping pills, and Nyquil. Pt reports that he became overwhelmed after an argument with his girlfriend and with stress from not having a job. He is living with his girlfriend and 55mos[address]. He expresses a desire for couples counseling as well as individual counseling to address issues with himself and in his relationship. Pt agreeable to contact with girlfriend and denies tobacco use. Patient will benefit from crisis stabilization, medication evaluation, group therapy and psycho education in addition to case management for discharge planning.     Review of initial/current patient goals per problem list:  1. Goal(s): Patient will participate in aftercare plan   Met: Yes   Target date: 3-5 days post admission date   As evidenced by: Patient will participate within aftercare plan AEB aftercare provider and housing plan at discharge being identified.  9/8: Goal met: Patient plans to return home to follow up with outpatient services      2. Goal (s):  Patient will exhibit decreased depressive symptoms and suicidal ideations.   Met: Goal Progressing   Target date: 3-5 days post admission date   As evidenced by: Patient will utilize self rating of depression at 3 or below and demonstrate decreased signs of depression or be deemed stable for discharge by MD.   9/8: Goal Progressing.       Attendees: Patient:    Family:    Physician: Dr. CParke Poisson Dr. LSabra Heck9/05/2015 9:30 AM  Nursing: JEulogio Bear BGrayland Ormond SJanann August, RN 07/06/2015 9:30 AM  Clinical Social Worker:  07/06/2015 9:30 AM  Other: HMaxie Better LCSWA 07/06/2015 9:30 AM  Other: VLucinda Dell MBeverly SessionsLiaison 07/06/2015 9:30 AM  Other: JLars Pinks Case Manager 07/06/2015 9:30 AM  Other: MTilford PillarRankin, NP 07/06/2015 9:30 AM  Other:               Scribe for Treatment Team:  KTilden Fossa MSW, [phone]

## 2015-07-07 MED ORDER — LORAZEPAM 0.5 MG PO TABS: 0.5000 mg | ORAL_TABLET | Freq: Four times a day (QID) | ORAL | Status: DC | PRN

## 2015-07-07 NOTE — Progress Notes (Signed)
DAR Note: Patient is calm and cooperative.  Rates hopelessness and depression at 0 out of10. Reports depression at 1/10.  Denies pain, auditory and visual hallucinations.  Compliant with medication and treatment plan.  Attended all group therapy.  Maintained on routine safety checks per protocol.  Patient states goal for today is to read a book by J.B. Neale Burly.  Observed socializing with peers and reading in the dayroom.

## 2015-07-07 NOTE — Progress Notes (Signed)
D: Pt is alert and oriented x 4. Pt at this time has several issues he is worried about; he states, "I have a lot going on in my mind right now; I have a 41 months old son that I have to care for; we just move down here from [place] and it's very difficult to make friends; I need a job that is permanent, I need to transfer all my credits from the school I was in [place] down here; I just have too much on my plate right now." Pt also endorses severe anxiety and depression from not knowing what the future would bring. Pt however, denies SI/HI/AVH and pain. Pt continues to be pleasant and cooperative.   A: Medications administered as prescribed.  Support, encouragement, and safe environment provided.  15-minute safety checks continue.  R: Pt was med compliant.  Pt attended wrap-up group. Safety checks continue.

## 2015-07-07 NOTE — Progress Notes (Signed)
Adult Psychoeducational Group Note  Date:  07/07/2015 Time:  10:03 PM  Group Topic/Focus:  Wrap-Up Group:   The focus of this group is to help patients review their daily goal of treatment and discuss progress on daily workbooks.  Participation Level:  Active  Participation Quality:  Appropriate and Attentive  Affect:  Angry  Cognitive:  Appropriate  Insight: Appropriate and Good  Engagement in Group:  Engaged  Modes of Intervention:  Education  Additional Comments:  Patient goal was to read his book "Take Charge of Positive Direction". Patient wants to focus on being positive and staying positive.   Merlinda Frederick 07/07/2015, 10:03 PM

## 2015-07-07 NOTE — Progress Notes (Addendum)
Patient ID: Andrew Mcknight, male   DOB: April 13, 1991, 24 y.o.   MRN: [id number] [facility] MD Progress Note  07/07/2015 12:53 PM Andrew Mcknight  MRN:  [id number] Subjective:  Patient reports some improvement but endorses ongoing ruminations and concerns/anxiety. Denies medication side effects at this time .  Objective : I have discussed case with treatment team , and have met with patient.  No disruptive behaviors on unit, and going to some groups . He presents pleasant, but anxious and ruminative about his stressors. A couples meeting was scheduled with patient and his GF today, as patient has identified relationship issues as one of his stressors, but GF was unable to stay for meeting at this time due to her work responsibilities . Patient remains ruminative and anxious, but does present more future oriented and states " I have decided I am going to move back to [place] to live with my grandparents, and I am going to focus on returning to college and graduating . " States " I will miss her and my child, but I know I can give them a better life after I finish college , so it is worth it ". States he is reading a book on positive affirmations and identifying positive aspects of oneself and states this has also helped him feel better. At this time does not endorse medication side effects.    Diagnosis:   MDD , without psychotic symptoms Patient Active Problem List   Diagnosis Date Noted  . Major depressive disorder, recurrent episode [F33.9] 07/04/2015  . Overdose [T50.901A] 07/01/2015  . Suicidal behavior [F48.9] 07/01/2015  . Major depressive disorder, single episode, severe [F32.2] 07/01/2015   Total Time spent with patient:25 minutes    Past Medical History: History reviewed. No pertinent past medical history.  Past Surgical History  Procedure Laterality Date  . Testicle surgery     Family History: History reviewed. No pertinent family history. Social History:  History  Alcohol Use No     History   Drug Use Not on file    Social History   Social History  . Marital Status: Single    Spouse Name: N/A  . Number of Children: N/A  . Years of Education: N/A   Social History Main Topics  . Smoking status: Never Smoker   . Smokeless tobacco: None  . Alcohol Use: No  . Drug Use: None  . Sexual Activity: Not Asked   Other Topics Concern  . None   Social History Narrative   Additional History:    Sleep: Good  Appetite:  Good   Assessment:   Musculoskeletal: Strength & Muscle Tone: within normal limits Gait & Station: normal Patient leans: N/A   Psychiatric Specialty Exam: Physical Exam  ROSdenies SOB, denies chest pain, denies abdominal discomfort or vomiting   Blood pressure 126/61, pulse 87, temperature 98 F (36.7 C), temperature source Oral, resp. rate 16, height 6' (1.829 m), weight 280 lb (127.007 kg).Body mass index is 37.97 kg/(m^2).  General Appearance:  Improved grooming   Eye Contact::  Good  Speech:  Normal Rate  Volume:  Normal  Mood: less depressed   Affect:   Less constricted, still anxious   Thought Process:  Linear  Orientation:  Full (Time, Place, and Person)  Thought Content:  denies any hallucinations, no delusions   Suicidal Thoughts:  No- today denies any suicidal or homicidal ideations. Denies any self injurious ideations .  Homicidal Thoughts:  No  Memory:  recent and remote grossly intact  Judgement:  Fair  Insight:  improved   Psychomotor Activity:  Normal  Concentration:  Good  Recall:  Good  Fund of Knowledge:Good  Language: Good  Akathisia:  Negative  Handed:  Right  AIMS (if indicated):     Assets:  Communication Skills Desire for Improvement Resilience  ADL's:  Fair, improving   Cognition: WNL  Sleep:  Number of Hours: 6.75     Current Medications: Current Facility-Administered Medications  Medication Dose Route Frequency Provider Last Rate Last Dose  . acetaminophen (TYLENOL) tablet 650 mg  650 mg Oral Q6H PRN  Encarnacion Slates, NP      . alum & mag hydroxide-simeth (MAALOX/MYLANTA) 200-200-20 MG/5ML suspension 30 mL  30 mL Oral Q4H PRN Encarnacion Slates, NP      . FLUoxetine (PROZAC) capsule 20 mg  20 mg Oral Daily Jenne Campus, MD   20 mg at 07/07/15 0815  . hydrOXYzine (ATARAX/VISTARIL) tablet 25 mg  25 mg Oral Q4H PRN Encarnacion Slates, NP      . magnesium hydroxide (MILK OF MAGNESIA) suspension 30 mL  30 mL Oral Daily PRN Encarnacion Slates, NP      . traZODone (DESYREL) tablet 50 mg  50 mg Oral QHS Encarnacion Slates, NP   50 mg at 07/04/15 2153    Lab Results:  No results found for this or any previous visit (from the past 48 hour(s)).  Physical Findings: AIMS: Facial and Oral Movements Muscles of Facial Expression: None, normal Lips and Perioral Area: None, normal Jaw: None, normal Tongue: None, normal,Extremity Movements Upper (arms, wrists, hands, fingers): None, normal Lower (legs, knees, ankles, toes): None, normal, Trunk Movements Neck, shoulders, hips: None, normal, Overall Severity Severity of abnormal movements (highest score from questions above): None, normal Incapacitation due to abnormal movements: None, normal Patient's awareness of abnormal movements (rate only patient's report): No Awareness, Dental Status Current problems with teeth and/or dentures?: No Does patient usually wear dentures?: No  CIWA:    COWS:      Assessment - at this time patient is better than upon admission, and his mood and affect appear improved- anxiety, ruminations are ongoing, but at this time he is more future oriented, and seems more  Hopeful and organized about future plans- as above, at this time intending to return to [employer] [facility] [employer] to complete college courses . Tolerating medications well at present .   Treatment Plan Summary: Daily contact with patient to assess and evaluate symptoms and progress in treatment, Medication management, Plan inpatient admission and medications as below  Continue Prozac 20 mgrs QDAY  for depression and anxiety Of note, patient wants to stop Trazodone, as he states he feels he can sleep without it .  D/C Vistaril PRNs , due to potential for cardiac rhythm  Changes on this medication.  Start Ativan 0.5 mgrs Q 6 hours PRN anxiety , as needed .  Continue to provide milieu, support, groups to help patient assess his stressors and identify improved coping skills to address these .   Medical Decision Making:  Established Problem, Stable/Improving (1), Review of Psycho-Social Stressors (1), Review or order clinical lab tests (1) and Review of Medication Regimen & Side Effects (2)     Andrew Mcknight 07/07/2015, 12:53 PM

## 2015-07-07 NOTE — [facility] Group Notes (Signed)
[facility] LCSW Group Therapy 07/07/2015 1:15 PM Type of Therapy: Group Therapy Participation Level: Minimal  Participation Quality: Attentive  Affect: Appropriate  Cognitive: Alert and Oriented  Insight: Developing/Improving and Engaged  Engagement in Therapy: Developing/Improving and Engaged  Modes of Intervention: Clarification, Confrontation, Discussion, Education, Exploration, Limit-setting, Orientation, Problem-solving, Rapport Building, Dance movement psychotherapist, Socialization and Support  Summary of Progress/Problems: The topic for today was feelings about relapse. Pt discussed what relapse prevention is to them and identified triggers that they are on the path to relapse. Pt processed their feeling towards relapse and was able to relate to peers. Pt discussed coping skills that can be used for relapse prevention. Patient did not participate in discussion despite CSW encouragement.   Samuella Bruin, MSW, [employer] Clinical Social Worker [facility] [phone]

## 2015-07-07 NOTE — [facility] Suicide Risk Assessment (Deleted)
[facility] INPATIENT:  Family/Significant Other Suicide Prevention Education  Suicide Prevention Education:  Patient Refusal for Family/Significant Other Suicide Prevention Education: The patient Andrew Mcknight has refused to provide written consent for family/significant other to be provided Family/Significant Other Suicide Prevention Education during admission and/or prior to discharge.  Physician notified. SPE reviewed with patient and brochure provided. Patient encouraged to return to hospital if having suicidal thoughts, patient verbalized his/her understanding and has no further questions at this time.   [name], [place] 07/07/2015, 6:58 PM

## 2015-07-07 NOTE — [facility] Group Notes (Signed)
   [facility] LCSW Aftercare Discharge Planning Group Note  07/07/2015  8:45 AM   Participation Quality: Alert, Appropriate and Oriented  Mood/Affect: Appropriate  Depression Rating: 1  Anxiety Rating: 0  Thoughts of Suicide: Pt denies SI/HI  Will you contract for safety? Yes  Current AVH: Pt denies  Plan for Discharge/Comments: Pt attended discharge planning group and actively participated in group. CSW provided pt with today's workbook. Patient reports having a safe place to stay and is agreeable to outpatient services at discharge.   Transportation Means: Pt reports access to transportation  Supports: No supports mentioned at this time  Samuella Bruin, MSW, [employer] Clinical Social Worker [employer] [phone]

## 2015-07-08 NOTE — Plan of Care (Signed)
Problem: Diagnosis: Increased Risk For Suicide Attempt Goal: LTG-Patient Family Informed of Increased Suicide Risk LTG (by discharge) Patient's family or significant other informed of patient's increased risk for suicide and they will be able to verbalize ways they can help the patient stay safe after discharge.  Outcome: Progressing Pt has contracted for safety     

## 2015-07-08 NOTE — [facility] Group Notes (Signed)
[facility] Group Notes:  (Clinical Social Work)  07/08/2015     1:15-2:15PM  Summary of Progress/Problems:   The main focus of today's process group was to contemplate healthy coping skills and how to learn more, not just in this setting, but after leaving the hospital.  Patients listed needs on the whiteboard and unhealthy coping techniques often used to fill needs.  Motivational Interviewing and the whiteboard were utilized to help patients explore in depth the perceived benefits and costs of unhealthy coping techniques, as well as the  benefits and costs of replacing that with a healthy coping skills.  Pulled out to talk to a practitioner, but when he returned was still very attentive, did not talk much.  Type of Therapy:  Group Therapy - Process   Participation Level:  Active  Participation Quality:  Appropriate, Attentive  Affect:  Blunted   Cognitive:  Alert, Appropriate and Oriented  Insight:  Engaged  Engagement in Therapy:  Engaged  Modes of Intervention:  Education, Motivational Interviewing  Ambrose Mantle, LCSW 07/08/2015, 4:37 PM

## 2015-07-08 NOTE — Progress Notes (Signed)
Adult Psychoeducational Group Note  Date:  07/08/2015 Time:  10:00 am  Group Topic/Focus:  Goals Group:   The focus of this group is to help patients establish daily goals to achieve during treatment and discuss how the patient can incorporate goal setting into their daily lives to aide in recovery.  Participation Level:  Active  Participation Quality:  Appropriate and Attentive  Affect:  Anxious and Appropriate  Cognitive:  Alert, Appropriate and Oriented  Insight: Appropriate and Good  Engagement in Group:  Developing/Improving  Modes of Intervention:  Discussion, Education and Orientation  Additional Comments:  Pt identified being more positive  In his life will help him  Andrew Mcknight 07/08/2015, 11:31 AM

## 2015-07-08 NOTE — Progress Notes (Signed)
Nursing Progress Note: 7-7p  D- Mood is depressed and anxious,rates anxiety at 5/10. Affect is blunted and appropriate. Pt is able to contract for safety. Reports sleep has improved . Goal for today is to stay positive  A - Observed pt interacting in group and in the milieu.Support and encouragement offered, safety maintained with q 15 minutes. Group discussion included healthy coping skills.During 1:1 pt stated he has been reading " Taking charge of your positive direction." feeling much better has decided to return to [place] even though he would be away from [place] and son. ' I have to finish school there and I do have a support system in [place].  R-Contracts for safety and continues to follow treatment plan, working on learning new coping skills.

## 2015-07-08 NOTE — Progress Notes (Signed)
Patient ID: Andrew Mcknight, male   DOB: 08/12/1991, 24 y.o.   MRN: [id number]  [facility] MD Progress Note  07/08/2015 4:19 PM Sharad Vaneaton  MRN:  [id number]   Subjective:  Patient states "I became depressed over not being able to get a job. I need to finish my degree in computer science, which will require me moving back to [place]. I have been reading a book to help improve my positive thinking skills. I do not want to hurt myself anymore."   Objective : Patient seen and chart is reviewed.  No disruptive behaviors on unit, and going to some groups . He presents pleasant, but anxious and ruminative about his stressors. Patient remains ruminative and anxious, but does present more future oriented and states " I have decided I am going to move back to [place] to live with my grandparents, and I am going to focus on returning to college and graduating . " States " I will miss her and my child, but I know I can give them a better life after I finish college , so it is worth it ". States he is reading a book on positive affirmations and identifying positive aspects of oneself and states this has also helped him feel better. At this time does not endorse medication side effects.    Diagnosis:   MDD , without psychotic symptoms Patient Active Problem List   Diagnosis Date Noted  . Major depressive disorder, recurrent episode [F33.9] 07/04/2015  . Overdose [T50.901A] 07/01/2015  . Suicidal behavior [F48.9] 07/01/2015  . Major depressive disorder, single episode, severe [F32.2] 07/01/2015   Total Time spent with patient: 20 minutes   Past Medical History: History reviewed. No pertinent past medical history.  Past Surgical History  Procedure Laterality Date  . Testicle surgery     Family History: History reviewed. No pertinent family history. Social History:  History  Alcohol Use No     History  Drug Use Not on file    Social History   Social History  . Marital Status: Single    Spouse Name: N/A  .  Number of Children: N/A  . Years of Education: N/A   Social History Main Topics  . Smoking status: Never Smoker   . Smokeless tobacco: None  . Alcohol Use: No  . Drug Use: None  . Sexual Activity: Not Asked   Other Topics Concern  . None   Social History Narrative   Additional History:    Sleep: Good  Appetite:  Good  Musculoskeletal: Strength & Muscle Tone: within normal limits Gait & Station: normal Patient leans: N/A  Psychiatric Specialty Exam: Physical Exam  Review of Systems  Constitutional: Negative.   HENT: Negative.   Eyes: Negative.   Respiratory: Negative.   Cardiovascular: Negative.   Gastrointestinal: Negative.   Genitourinary: Negative.   Musculoskeletal: Negative.   Skin: Negative.   Neurological: Negative.   Endo/Heme/Allergies: Negative.   Psychiatric/Behavioral: Positive for depression. The patient is nervous/anxious.   denies SOB, denies chest pain, denies abdominal discomfort or vomiting   Blood pressure 133/90, pulse 88, temperature 98 F (36.7 C), temperature source Oral, resp. rate 16, height 6' (1.829 m), weight 127.007 kg (280 lb).Body mass index is 37.97 kg/(m^2).  General Appearance: Casual   Eye Contact::  Good  Speech:  Normal Rate  Volume:  Normal  Mood: less depressed   Affect:   Less constricted, still anxious   Thought Process:  Linear  Orientation:  Full (Time, Place, and  Person)  Thought Content:  denies any hallucinations, no delusions   Suicidal Thoughts:  No- today denies any suicidal or homicidal ideations. Denies any self injurious ideations .  Homicidal Thoughts:  No  Memory:  recent and remote grossly intact   Judgement:  Fair  Insight:  improved   Psychomotor Activity:  Normal  Concentration:  Good  Recall:  Good  Fund of Knowledge:Good  Language: Good  Akathisia:  Negative  Handed:  Right  AIMS (if indicated):     Assets:  Communication Skills Desire for Improvement Resilience  ADL's:  Fair, improving    Cognition: WNL  Sleep:  Number of Hours: 6.75     Current Medications: Current Facility-Administered Medications  Medication Dose Route Frequency Provider Last Rate Last Dose  . acetaminophen (TYLENOL) tablet 650 mg  650 mg Oral Q6H PRN Sanjuana Kava, NP      . alum & mag hydroxide-simeth (MAALOX/MYLANTA) 200-200-20 MG/5ML suspension 30 mL  30 mL Oral Q4H PRN Sanjuana Kava, NP      . FLUoxetine (PROZAC) capsule 20 mg  20 mg Oral Daily Craige Cotta, MD   20 mg at 07/08/15 0853  . LORazepam (ATIVAN) tablet 0.5 mg  0.5 mg Oral Q6H PRN Rockey Situ Cobos, MD      . magnesium hydroxide (MILK OF MAGNESIA) suspension 30 mL  30 mL Oral Daily PRN Sanjuana Kava, NP        Lab Results:  No results found for this or any previous visit (from the past 48 hour(s)).  Physical Findings: AIMS: Facial and Oral Movements Muscles of Facial Expression: None, normal Lips and Perioral Area: None, normal Jaw: None, normal Tongue: None, normal,Extremity Movements Upper (arms, wrists, hands, fingers): None, normal Lower (legs, knees, ankles, toes): None, normal, Trunk Movements Neck, shoulders, hips: None, normal, Overall Severity Severity of abnormal movements (highest score from questions above): None, normal Incapacitation due to abnormal movements: None, normal Patient's awareness of abnormal movements (rate only patient's report): No Awareness, Dental Status Current problems with teeth and/or dentures?: No Does patient usually wear dentures?: No  CIWA:    COWS:      Assessment - at this time patient is better than upon admission, and his mood and affect appear improved- anxiety, ruminations are ongoing, but at this time he is more future oriented, and seems more  Hopeful and organized about future plans- as above, at this time intending to return to [employer] [facility] to complete college courses . Tolerating medications well at present .   Treatment Plan Summary: Daily contact with patient to assess and  evaluate symptoms and progress in treatment, Medication management, Plan inpatient admission and medications as below  Continue Prozac 20 mgrs QDAY for depression and anxiety Of note, patient wants to stop Trazodone, as he states he feels he can sleep without it .  D/C Vistaril PRNs , due to potential for cardiac rhythm  Changes on this medication.  Continue Ativan 0.5 mgrs Q 6 hours PRN anxiety , as needed .  Continue to provide milieu, support, groups to help patient assess his stressors and identify improved coping skills to address these .   Medical Decision Making:  Established Problem, Stable/Improving (1), Review of Psycho-Social Stressors (1), Review or order clinical lab tests (1) and Review of Medication Regimen & Side Effects (2)  DAVIS, LAURA, NP-C 07/08/2015, 4:19 PM  Reviewed the information documented and agree with the treatment plan.  Mikaelyn Arthurs,JANARDHAHA R. 07/08/2015 4:55 PM

## 2015-07-08 NOTE — Progress Notes (Signed)
D: Patient seen interacting with peers on dayroom. Appear cheerful. Patient told this Clinical research associate that I learned so much from a book he was reading. He stated "The book is all positivity and I will try to change my attitude towards positive things. The book is awesome. I intend to finish it before I leave this hospital". Denies pain, SI, AH/VH at this time. Denies depression. No new complaint. A: Support and encouragement offered to patient. Due medications given as ordered. Every 15 minutes check for safety maintained. Will continue to monitor patient for safety and stability.  R: Patient remains appropriate and safe.

## 2015-07-08 NOTE — Progress Notes (Signed)
Adult Psychoeducational Group Note  Date:  07/08/2015 Time:  10:11 PM  Group Topic/Focus:  Wrap-Up Group:   The focus of this group is to help patients review their daily goal of treatment and discuss progress on daily workbooks.  Participation Level:  Active  Participation Quality:  Appropriate and Sharing  Affect:  Appropriate  Cognitive:  Alert  Insight: Good  Engagement in Group:  Engaged  Modes of Intervention:  Discussion and Education  Additional Comments:  Patient reported finishing a book.  Patient confirmed working to use positive cognitions and avoid the use negative words and cognitions.   Elmore Guise N 07/08/2015, 10:11 PM

## 2015-07-09 DIAGNOSIS — F332 Major depressive disorder, recurrent severe without psychotic features: Secondary | ICD-10-CM

## 2015-07-09 NOTE — Progress Notes (Signed)
D: Patient seen on day room interestingly watching football. States "My day's been good". Denies pain, SI, AH/VH at this time. Made no further complaints.  A: Patient encouraged to continue with the treatment plan and verbalize needs to staff. Every 15 minutes check maintained for safety. Will continue to monitor patient for safety and stability.  R: Patient remains safe.

## 2015-07-09 NOTE — Progress Notes (Signed)
Patient ID: Andrew Mcknight, male   DOB: [birth year], 24 y.o.   MRN: [id number]  [facility] MD Progress Note  07/09/2015 3:57 PM Clearance Chenault  MRN:  [id number]   Subjective:  Patient states "I have it all lined up. After I leave here I will get on a bus to [place]. I will finish school. I will keep having mental health treatment up there. It will be hard being away from my girlfriend but we have been in that situation before. I am no longer suicidal. My depression is getting better. I have a better sense of purpose now and know I can get a better job in the future. I will keep supporting my family."   Objective : Patient seen and chart is reviewed.  No disruptive behaviors on unit, and going to some groups . He presents pleasant, but anxious and ruminative about his stressors. Patient is less ruminative and anxious, and presents more future oriented and states " I have decided I am going to move back to [place] to live with my grandparents, and I am going to focus on returning to college and graduating . " States " I will miss her and my child, but I know I can give them a better life after I finish college , so it is worth it ".  At this time does not endorse medication side effects.    Diagnosis:   MDD , without psychotic symptoms Patient Active Problem List   Diagnosis Date Noted  . Major depressive disorder, recurrent episode [F33.9] 07/04/2015  . Overdose [T50.901A] 07/01/2015  . Suicidal behavior [F48.9] 07/01/2015  . Major depressive disorder, single episode, severe [F32.2] 07/01/2015   Total Time spent with patient: 20 minutes   Past Medical History: History reviewed. No pertinent past medical history.  Past Surgical History  Procedure Laterality Date  . Testicle surgery     Family History: History reviewed. No pertinent family history. Social History:  History  Alcohol Use No     History  Drug Use Not on file    Social History   Social History  . Marital Status: Single    Spouse Name:  N/A  . Number of Children: N/A  . Years of Education: N/A   Social History Main Topics  . Smoking status: Never Smoker   . Smokeless tobacco: None  . Alcohol Use: No  . Drug Use: None  . Sexual Activity: Not Asked   Other Topics Concern  . None   Social History Narrative   Additional History:    Sleep: Good  Appetite:  Good  Musculoskeletal: Strength & Muscle Tone: within normal limits Gait & Station: normal Patient leans: N/A  Psychiatric Specialty Exam: Physical Exam  Review of Systems  Constitutional: Negative.   HENT: Negative.   Eyes: Negative.   Respiratory: Negative.   Cardiovascular: Negative.   Gastrointestinal: Negative.   Genitourinary: Negative.   Musculoskeletal: Negative.   Skin: Negative.   Neurological: Negative.   Endo/Heme/Allergies: Negative.   Psychiatric/Behavioral: Positive for depression. The patient is nervous/anxious.   denies SOB, denies chest pain, denies abdominal discomfort or vomiting   Blood pressure 144/84, pulse 95, temperature 97.8 F (36.6 C), temperature source Oral, resp. rate 18, height 6' (1.829 m), weight 127.007 kg (280 lb).Body mass index is 37.97 kg/(m^2).  General Appearance: Casual   Eye Contact::  Good  Speech:  Normal Rate  Volume:  Normal  Mood: less depressed   Affect:   Less constricted, still anxious  Thought Process:  Linear  Orientation:  Full (Time, Place, and Person)  Thought Content:  denies any hallucinations, no delusions   Suicidal Thoughts:  No- today denies any suicidal or homicidal ideations. Denies any self injurious ideations .  Homicidal Thoughts:  No  Memory:  recent and remote grossly intact   Judgement:  Fair  Insight:  improved   Psychomotor Activity:  Normal  Concentration:  Good  Recall:  Good  Fund of Knowledge:Good  Language: Good  Akathisia:  Negative  Handed:  Right  AIMS (if indicated):     Assets:  Communication Skills Desire for Improvement Resilience  ADL's:  Fair,  improving   Cognition: WNL  Sleep:  Number of Hours: 6.75     Current Medications: Current Facility-Administered Medications  Medication Dose Route Frequency Provider Last Rate Last Dose  . acetaminophen (TYLENOL) tablet 650 mg  650 mg Oral Q6H PRN Sanjuana Kava, NP      . alum & mag hydroxide-simeth (MAALOX/MYLANTA) 200-200-20 MG/5ML suspension 30 mL  30 mL Oral Q4H PRN Sanjuana Kava, NP      . FLUoxetine (PROZAC) capsule 20 mg  20 mg Oral Daily Craige Cotta, MD   20 mg at 07/09/15 0836  . LORazepam (ATIVAN) tablet 0.5 mg  0.5 mg Oral Q6H PRN Rockey Situ Cobos, MD      . magnesium hydroxide (MILK OF MAGNESIA) suspension 30 mL  30 mL Oral Daily PRN Sanjuana Kava, NP        Lab Results:  No results found for this or any previous visit (from the past 48 hour(s)).  Physical Findings: AIMS: Facial and Oral Movements Muscles of Facial Expression: None, normal Lips and Perioral Area: None, normal Jaw: None, normal Tongue: None, normal,Extremity Movements Upper (arms, wrists, hands, fingers): None, normal Lower (legs, knees, ankles, toes): None, normal, Trunk Movements Neck, shoulders, hips: None, normal, Overall Severity Severity of abnormal movements (highest score from questions above): None, normal Incapacitation due to abnormal movements: None, normal Patient's awareness of abnormal movements (rate only patient's report): No Awareness, Dental Status Current problems with teeth and/or dentures?: No Does patient usually wear dentures?: No  CIWA:    COWS:      Assessment - at this time patient is better than upon admission, and his mood and affect appear improved- anxiety, ruminations are ongoing, but at this time he is more future oriented, and seems more  Hopeful and organized about future plans- as above, at this time intending to return to [employer] to complete college courses . Tolerating medications well at present .   Treatment Plan Summary: Daily contact with patient to assess  and evaluate symptoms and progress in treatment, Medication management, Plan inpatient admission and medications as below  Continue Prozac 20 mgrs QDAY for depression and anxiety Continue Ativan 0.5 mgrs Q 6 hours PRN anxiety , as needed .  Continue to provide milieu, support, groups to help patient assess his stressors and identify improved coping skills to address these .   Medical Decision Making:  Established Problem, Stable/Improving (1), Review of Psycho-Social Stressors (1), Review or order clinical lab tests (1) and Review of Medication Regimen & Side Effects (2)  DAVIS, LAURA, NP-C 07/09/2015, 3:57 PM  Reviewed the information documented and agree with the treatment plan.  Lochlann Mastrangelo,JANARDHAHA R. 07/09/2015 4:44 PM
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
## 2015-07-09 NOTE — Plan of Care (Signed)
Problem: Alteration in mood Goal: LTG-Patient reports reduction in suicidal thoughts (Patient reports reduction in suicidal thoughts and is able to verbalize a safety plan for whenever patient is feeling suicidal)  Outcome: Progressing Pt states he will talk with someone he trusts if feeling suicidal, or will seek services  Problem: Ineffective individual coping Goal: LTG: Patient will report a decrease in negative feelings Outcome: Progressing Progressing. Reports feeling better

## 2015-07-09 NOTE — Progress Notes (Signed)
D: Pt reports slept good last night without sleep medication. Appetite good and energy level normal. Denies depression, anxiety and hopelessness. Denies SI, HI, A/V/H and pain. Goal for today is to work on discharge plan. Will talk with mom and girlfriend to assist with plan.  A:Encouragement and support offered. 15 min checks continues for safety. Medications given as prescribed. R: Pt receptive and able to state coping skills to help after discharge. Safety maintained on unit.

## 2015-07-09 NOTE — [facility] Group Notes (Signed)
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
## 2015-07-09 NOTE — Progress Notes (Signed)
D: Patient seen watching TV and socializing with peers. Denies pain, SI, AH/VH. Made no new complaint.  A: Patient encouraged to continue with the treatment plan and verbalize needs to staff. Every 15 minutes check for safety maintained. Will continue to monitor patient for safety and stability. R: Patient remains safe.

## 2015-07-10 MED ORDER — FLUOXETINE HCL 20 MG PO CAPS: 20.0000 mg | ORAL_CAPSULE | Freq: Every day | ORAL | Status: AC

## 2015-07-10 NOTE — Progress Notes (Addendum)
Patient ID: Andrew Mcknight, male   DOB: 1991-03-09, 24 y.o.   MRN: [id number]  Pt currently presents with a pleasant affect and appropriate behavior. Per self inventory, pt rates depression, hopelessness and anxiety at a 0. Pt's daily goal is to "getting discharged" and they intend to do so by "talk to doctors, meet with social worker, get perscriptions."   Pt provided with medications per providers orders. Pt's labs and vitals were monitored throughout the day. Pt supported emotionally and encouraged to express concerns and questions. Pt educated on medications.  Pt's safety ensured with 15 minute and environmental checks. Pt currently denies SI/HI and A/V hallucinations. Pt verbally agrees to seek staff if SI/HI or A/VH occurs and to consult with staff before acting on these thoughts. Pt plan is to "go back to [place], my girlfriend is going to come get me first." Pt to be discharged today. Will continue POC.

## 2015-07-10 NOTE — [facility] Group Notes (Signed)
[facility] LCSW Aftercare Discharge Planning Group Note  07/10/2015 8:45 AM  Participation Quality: Alert, Appropriate and Oriented  Mood/Affect: Flat  Depression Rating: 0  Anxiety Rating: 0  Thoughts of Suicide: Pt denies SI/HI  Will you contract for safety? Yes  Current AVH: Pt denies  Plan for Discharge/Comments: Pt attended discharge planning group and actively participated in group. CSW discussed suicide prevention education with the group and encouraged them to discuss discharge planning and any relevant barriers. Pt reports that he would like to leave today. Describes a plan of returning to [place] to finish school.   Transportation Means: Pt reports access to transportation  Supports: No supports mentioned at this time  Chad Cordial, LCSWA 07/10/2015 9:54 AM

## 2015-07-10 NOTE — Discharge Summary (Signed)
Physician Discharge Summary Note  Patient:  Andrew Mcknight is an 24 y.o., male MRN:  [id number] DOB:  Sep 24, 1991 Patient phone:  [phone] (home)  Patient address:   [address] [name] [address],  Total Time spent with patient: 30 minutes  Date of Admission:  07/04/2015 Date of Discharge: 07/10/2015  Reason for Admission:  Suicide attempt  Principal Problem: Major depressive disorder, recurrent, severe without psychotic features Discharge Diagnoses: Patient Active Problem List   Diagnosis Date Noted  . Major depressive disorder, recurrent, severe without psychotic features [F33.2]   . Major depressive disorder, recurrent episode [F33.9] 07/04/2015  . Overdose [T50.901A] 07/01/2015  . Suicidal behavior [F48.9] 07/01/2015  . Major depressive disorder, single episode, severe [F32.2] 07/01/2015    Musculoskeletal: Strength & Muscle Tone: within normal limits Gait & Station: normal Patient leans: N/A  Psychiatric Specialty Exam: Physical Exam  Psychiatric: He has a normal mood and affect. His speech is normal and behavior is normal. Judgment and thought content normal. Cognition and memory are normal.    Review of Systems  Constitutional: Negative.   HENT: Negative.   Eyes: Negative.   Respiratory: Negative.   Cardiovascular: Negative.   Gastrointestinal: Negative.   Genitourinary: Negative.   Musculoskeletal: Negative.   Skin: Negative.   Neurological: Negative.   Endo/Heme/Allergies: Negative.   Psychiatric/Behavioral: Positive for depression (Stabilized with treatments). Negative for suicidal ideas, hallucinations, memory loss and substance abuse. The patient is not nervous/anxious and does not have insomnia.     Blood pressure 141/87, pulse 89, temperature 98.2 F (36.8 C), temperature source Oral, resp. rate 18, height 6' (1.829 m), weight 127.007 kg (280 lb).Body mass index is 37.97 kg/(m^2).  See Physician SRA     Have you used any form of tobacco  in the last 30 days? (Cigarettes, Smokeless Tobacco, Cigars, and/or Pipes): No  Has this patient used any form of tobacco in the last 30 days? (Cigarettes, Smokeless Tobacco, Cigars, and/or Pipes) No  Past Medical History: History reviewed. No pertinent past medical history.  Past Surgical History  Procedure Laterality Date  . Testicle surgery     Family History: History reviewed. No pertinent family history. Social History:  History  Alcohol Use No     History  Drug Use Not on file    Social History   Social History  . Marital Status: Single    Spouse Name: N/A  . Number of Children: N/A  . Years of Education: N/A   Social History Main Topics  . Smoking status: Never Smoker   . Smokeless tobacco: None  . Alcohol Use: No  . Drug Use: None  . Sexual Activity: Not Asked   Other Topics Concern  . None   Social History Narrative    Past Psychiatric History: Hospitalizations:  Outpatient Care:  Substance Abuse Care:  Self-Mutilation:  Suicidal Attempts:  Violent Behaviors:   Risk to Self: Is patient at risk for suicide?: Yes What has been your use of drugs/alcohol within the last 12 months?: Pt denies Risk to Others:   Prior Inpatient Therapy:   Prior Outpatient Therapy:    Level of Care:  OP  Hospital Course:   [name] is a 24 year old single male. Patient states he was recently admitted to inpatient medical unit following an overdose . He states he has been dealing with significant depression, related to stressors as below .Stated he has been dealing with significant stressors, related to job, family, financial issues . States that  prior to overdose he had argument with GF, and was having difficulty getting to work due to transportation difficulties " after not being able to get a job for months ", leading to fear he would lose his job only a few days after being hired .He states he went to a local pharmacy and bought some " sleeping pills and some Tylenol " and  took these in a suicide attempt. Stated he then went to sleep with the intention of dying, but then woke up, vomited, and told his GF, leading to admission to hospital.         [name] was admitted to the adult 400 unit. He was evaluated and his symptoms were identified. Medication management was discussed and initiated. His Prozac was increased to 20 mg daily for treatment of depression.  He was oriented to the unit and encouraged to participate in unit programming. Medical problems were identified and treated appropriately. Home medication was restarted as needed.        The patient was evaluated each day by a clinical provider to ascertain the patient's response to treatment.  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 He felt that then he would be better equipped to provide for his family. Dravyn discussed the stressor of being away from girlfriend and child, but reports having dealt with this situation before. Improvement was noted by the patient's report of decreasing symptoms, improved sleep and appetite, affect, medication tolerance, behavior, and participation in unit programming.  He was asked each day to complete a self inventory noting mood, mental status, pain, new symptoms, anxiety and concerns.         He responded well to medication and being in a therapeutic and supportive environment. Positive and appropriate behavior was noted and the patient was motivated for recovery.  The patient worked closely with the treatment team and case manager to develop a discharge plan with appropriate goals. Coping skills, problem solving as well as relaxation therapies were also part of the unit programming. Patient reported reading a book on positive thinking during his admission and discussed what he had learned during follow up assessments.          By the day of discharge he was in much improved condition than upon  admission.  Symptoms were reported as significantly decreased or resolved completely. The patient denied SI/HI and voiced no AVH. He was motivated to continue taking medication with a goal of continued improvement in mental health.    Pilot Prindle was discharged home with a plan to follow up as noted below. His follow up was scheduled in [place] per patient request.   Consults:  psychiatry  Significant Diagnostic Studies:  Chemistry profile, Lipid profile, UDS negative   Discharge Vitals:   Blood pressure 141/87, pulse 89, temperature 98.2 F (36.8 C), temperature source Oral, resp. rate 18, height 6' (1.829 m), weight 127.007 kg (280 lb). Body mass index is 37.97 kg/(m^2). Lab Results:   No results found for this or any previous visit (from the past 72 hour(s)).  Physical Findings: AIMS: Facial and Oral Movements Muscles of Facial Expression: None, normal Lips and Perioral Area: None, normal Jaw: None, normal Tongue: None, normal,Extremity Movements Upper (arms, wrists, hands, fingers): None, normal Lower (legs, knees, ankles, toes): None, normal, Trunk Movements Neck, shoulders, hips: None, normal, Overall Severity Severity of abnormal movements (highest score from questions above): None, normal Incapacitation  due to abnormal movements: None, normal Patient's awareness of abnormal movements (rate only patient's report): No Awareness, Dental Status Current problems with teeth and/or dentures?: No Does patient usually wear dentures?: No  CIWA:    COWS:      See Psychiatric Specialty Exam and Suicide Risk Assessment completed by Attending Physician prior to discharge.  Discharge destination:  Home  Is patient on multiple antipsychotic therapies at discharge:  No   Has Patient had three or more failed trials of antipsychotic monotherapy by history:  No  Recommended Plan for Multiple Antipsychotic Therapies: NA     Medication List    TAKE these medications       Indication   FLUoxetine 20 MG capsule  Commonly known as:  PROZAC  Take 1 capsule (20 mg total) by mouth daily.   Indication:  Depression       Follow-up Information    Follow up with [facility].   Why:  Please call the number below to reach the intake department to schedule your appointment. If this area is not convenient for you, please request to be referred to another location that is closer to your home   Contact information:   [facility] (Adult Outpatient Clinic)  [address], [address]  [place], [place] [address] [phone]      Follow-up recommendations:   Activity: as tolerated  Diet: Regular  Tests: NA Other: see below   Comments:   Take all your medications as prescribed by your mental healthcare provider.  Report any adverse effects and or reactions from your medicines to your outpatient provider promptly.  Patient is instructed and cautioned to not engage in alcohol and or illegal drug use while on prescription medicines.  In the event of worsening symptoms, patient is instructed to call the crisis hotline, 911 and or go to the nearest ED for appropriate evaluation and treatment of symptoms.  Follow-up with your primary care provider for your other medical issues, concerns and or health care needs.   Total Discharge Time: Greater than 30 minutes  Signed: Fransisca Kaufmann, NP-C 07/10/2015, 12:50 PM   Patient seen, Suicide Assessment Completed.  Disposition Plan Reviewed

## 2015-07-10 NOTE — Progress Notes (Signed)
  [facility] Adult Case Management Discharge Plan :  Will you be returning to the same living situation after discharge:  Yes,  Pt returning briefly to girlfriend's house and then relocating to [place] At discharge, do you have transportation home?: Yes,  girlfriend to provide transportation Do you have the ability to pay for your medications: Yes,  Pt provided with prescriptions  Release of information consent forms completed and in the chart;  Patient's signature needed at discharge.  Patient to Follow up at: Follow-up Information    Follow up with [facility].   Why:  Please call the number below to reach the intake department to schedule your appointment. If this area is not convenient for you, please request to be referred to another location that is closer to your home   Contact information:   [facility] (Adult Outpatient Clinic)  [address]  [place], [place] [address] [phone]      Patient denies SI/HI: Yes,  Pt denies    Safety Planning and Suicide Prevention discussed: Yes,  with girlfriend; see SPE note for further detail  Have you used any form of tobacco in the last 30 days? (Cigarettes, Smokeless Tobacco, Cigars, and/or Pipes): No  Has patient been referred to the Quitline?: N/A patient is not a smoker  Andrew Mcknight 07/10/2015, 1:17 PM

## 2015-07-10 NOTE — [facility] Suicide Risk Assessment (Signed)
[facility] Discharge Suicide Risk Assessment   Demographic Factors:  24 year old man, single, was living with fiance, has young child, at this time plans to move  Back to [facility] soon to return to college   Total Time spent with patient: 30 minutes  Musculoskeletal: Strength & Muscle Tone: within normal limits Gait & Station: normal Patient leans: N/A  Psychiatric Specialty Exam: Physical Exam  ROS  Blood pressure 141/87, pulse 89, temperature 98.2 F (36.8 C), temperature source Oral, resp. rate 18, height 6' (1.829 m), weight 280 lb (127.007 kg).Body mass index is 37.97 kg/(m^2).  General Appearance: Well Groomed  Eye Contact::  Good  Speech:  Normal Rate409  Volume:  Normal  Mood:  improved  compared to admission, at this time minimizes depression  Affect:  Appropriate- does not feel anxious at this time  Thought Process:  Goal Directed  Orientation:  Full (Time, Place, and Person)  Thought Content:  denies hallucinations, no delusions, not internally preoccupied   Suicidal Thoughts:  No  Homicidal Thoughts:  No  Memory:  recent and remote grossly intact   Judgement:  Other:  improved  Insight:  improved   Psychomotor Activity:  Normal  Concentration:  Good  Recall:  Good  Fund of Knowledge:Good  Language: Good  Akathisia:  Negative  Handed:  Right  AIMS (if indicated):     Assets:  Communication Skills Desire for Improvement Resilience  Sleep:  Number of Hours: 6  Cognition: WNL  ADL's:  Intact   Have you used any form of tobacco in the last 30 days? (Cigarettes, Smokeless Tobacco, Cigars, and/or Pipes): No  Has this patient used any form of tobacco in the last 30 days? (Cigarettes, Smokeless Tobacco, Cigars, and/or Pipes) No  Mental Status Per Nursing Assessment::   On Admission:     Current Mental Status by Physician: At this time patient is improved compared to admission- presents well groomed, good eye contact, speech normal, no thought disorder, no SI, no HI, no  psychotic symptoms, future oriented, plans to return to [facility], live with relatives, get a job and enroll in college for next [employer] .   Loss Factors: Financial stressors, some relationship stress, recent move from [facility] to [place] several months ago   Historical Factors: History of depression and suicide attempt - no prior history of suicide attempt, no prior history of psychotic symptoms  Risk Reduction Factors:   Responsible for children under 70 years of age, Sense of responsibility to family, Living with another person, especially a relative and Positive coping skills or problem solving skills  Continued Clinic al Symptoms:  As noted, currently significantly improved    Cognitive Features That Contribute To Risk:  No gross cognitive deficits noted upon discharge. Is alert , attentive, and oriented x 3   Suicide Risk:  Mild:  Suicidal ideation of limited frequency, intensity, duration, and specificity.  There are no identifiable plans, no associated intent, mild dysphoria and related symptoms, good self-control (both objective and subjective assessment), few other risk factors, and identifiable protective factors, including available and accessible social support.  Principal Problem: Major Depression, no psychotic features  Discharge Diagnoses:  Patient Active Problem List   Diagnosis Date Noted  . Major depressive disorder, recurrent, severe without psychotic features [F33.2]   . Major depressive disorder, recurrent episode [F33.9] 07/04/2015  . Overdose [T50.901A] 07/01/2015  . Suicidal behavior [F48.9] 07/01/2015  . Major depressive disorder, single episode, severe [F32.2] 07/01/2015      Plan Of  Care/Follow-up recommendations:  Activity:  as tolerated  Diet:  Regular  Tests:  NA Other:  see below   Is patient on multiple antipsychotic therapies at discharge:  No   Has Patient had three or more failed trials of antipsychotic monotherapy by history:  No  Recommended  Plan for Multiple Antipsychotic Therapies: NA  Patient is being discharged in improved spirits . Patient is planning on returning to [facility] within the next few days- plans to follow up for outpatient psychiatric management there .   Hortence Charter 07/10/2015, 10:36 AM

## 2015-07-10 NOTE — Progress Notes (Signed)
Recreation Therapy Notes  Date: 09.12.2016 Time: 9:30am Location: 300 Hall Group room   Group Topic: Stress Management  Goal Area(s) Addresses:  Patient will actively participate in stress management techniques presented during session.   Behavioral Response: Did not attend.   Cristie Mckinney L Varetta Chavers, LRT/CTRS        Stevon Gough L 07/10/2015 11:47 AM 

## 2015-07-10 NOTE — BHH Suicide Risk Assessment (Signed)
BHH INPATIENT:  Family/Significant Other Suicide Prevention Education  Suicide Prevention Education:  Education Completed; Debarah Crape, Pt's girlfriend [phone]) has been identified by the patient as the family member/significant other with whom the patient will be residing, and identified as the person(s) who will aid the patient in the event of a mental health crisis (suicidal ideations/suicide attempt).  With written consent from the patient, the family member/significant other has been provided the following suicide prevention education, prior to the and/or following the discharge of the patient.  The suicide prevention education provided includes the following:  Suicide risk factors  Suicide prevention and interventions  National Suicide Hotline telephone number  [facility] assessment telephone number  [facility] Emergency Assistance 911  [facility] and/or Residential Mobile Crisis Unit telephone number  Request made of family/significant other to:  Remove weapons (e.g., guns, rifles, knives), all items previously/currently identified as safety concern.    Remove drugs/medications (over-the-counter, prescriptions, illicit drugs), all items previously/currently identified as a safety concern.  The family member/significant other verbalizes understanding of the suicide prevention education information provided.  The family member/significant other agrees to remove the items of safety concern listed above.  Elaina Hoops 07/10/2015, 1:20 PM

## 2015-07-10 NOTE — Progress Notes (Addendum)
Patient ID: Andrew Mcknight, male   DOB: 1991/07/12, 24 y.o.   MRN: [id number]  Pt discharged home with his girlfriend. Pt was stable and appreciative at that time. All papers and prescriptions were given and valuables returned. Verbal understanding expressed. Denies SI/HI and A/VH. Pt given opportunity to express concerns and ask questions.

## 2015-07-10 NOTE — Tx Team (Signed)
Interdisciplinary Treatment Plan Update (Adult) Date: 07/10/2015   Time Reviewed: 9:30 AM  Progress in Treatment: Attending groups: Yes Participating in groups: Yes Taking medication as prescribed: Yes Tolerating medication: Yes Family/Significant other contact made: No, CSW assessing for appropriate contacts  Patient understands diagnosis: Yes Discussing patient identified problems/goals with staff: Yes Medical problems stabilized or resolved: Yes Denies suicidal/homicidal ideation: Yes Issues/concerns per patient self-inventory: Yes Other:  New problem(s) identified: N/A  Discharge Plan or Barriers: Home with outpatient    Reason for Continuation of Hospitalization:  Depression Anxiety Medication Stabilization   Comments: N/A  Estimated length of stay: 2-3 days   Patient is a 24 year old [place] American male with a diagnosis of MDD, single episode, severe. Pt was admitted to [facility] after serious overdose on tylenol, sleeping pills, and Nyquil. Pt reports that he became overwhelmed after an argument with his girlfriend and with stress from not having a job. He is living with his girlfriend and [address]. He expresses a desire for couples counseling as well as individual counseling to address issues with himself and in his relationship. Pt agreeable to contact with girlfriend and denies tobacco use. Patient will benefit from crisis stabilization, medication evaluation, group therapy and psycho education in addition to case management for discharge planning.     Review of initial/current patient goals per problem list:  1. Goal(s): Patient will participate in aftercare plan   Met: Yes   Target date: 3-5 days post admission date   As evidenced by: Patient will participate within aftercare plan AEB aftercare provider and housing plan at discharge being identified.  9/8: Goal met: Patient plans to return home to follow up with outpatient services      2. Goal (s):  Patient will exhibit decreased depressive symptoms and suicidal ideations.   Met: Yes   Target date: 3-5 days post admission date   As evidenced by: Patient will utilize self rating of depression at 3 or below and demonstrate decreased signs of depression or be deemed stable for discharge by MD.   9/8: Goal Progressing.   07/10/15: Pt reports that his mood has improved, rates depression at 0 and denies SI      Attendees: Patient:    Family:    Physician: Dr. CParke Poisson Dr. LSabra Heck9/09/2015 12:58 PM   Nursing: SMarcella Dubs RN 07/10/2015 12:58 PM   Clinical Social Worker: CBo Mcclintock 07/10/2015 12:58 PM   Other:    Other: VJake BatheLiaison 07/10/2015 12:58 PM   Other: JLars Pinks Case Manager 07/10/2015 12:58 PM   Other:    Other:               Scribe for Treatment Team:   LPeri Maris LStowellWork [phone]

## 2015-07-19 ENCOUNTER — Telehealth: Payer: Self-pay | Admitting: Professional Counselor

## 2015-07-19 NOTE — Telephone Encounter (Signed)
Andrew Mcknight called reporting he re-located to [place] and lost his discharge paperwork. Information was reviewed from his AVS and DC summary from [facility] with his permission over the phone. Information given about aftercare to patient via phone for follow up purposes.   No other needs voiced by patient. Encouraged to call back if he needed anything.  Patient voiced understanding.  Deretha Emory, MSW Clinical Social Work: Emergency Room [phone]

## 2016-11-21 IMAGING — CT CT HEAD W/O CM
1 series · 16 of 30 positions shown, 20 images · non-contrast
Comparison: None.

CLINICAL DATA: Acetaminophen overdose and confusion.

EXAM:
CT HEAD WITHOUT CONTRAST
TECHNIQUE: Contiguous axial images were obtained from the base of the skull
through the vertex without intravenous contrast.

[Series 2: head 5.0 h30s · axial · 0.49mm/px · z∈[+1004,+1154]mm · 16 of 34 slices shown, 20 images]
[im 2/34  brain]
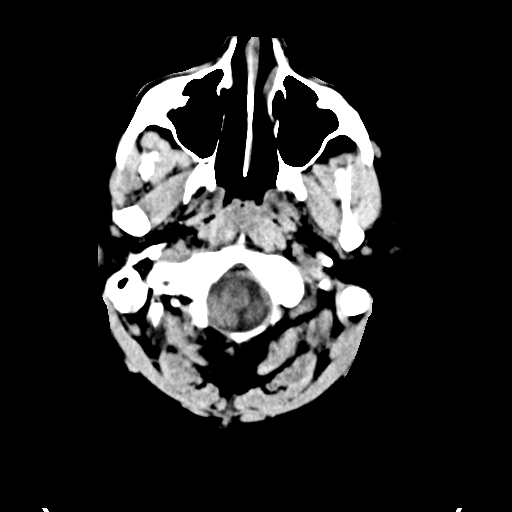
[im 2/34  bone]
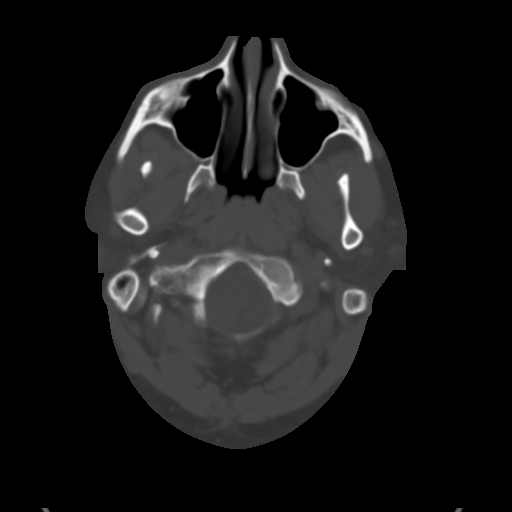
[im 4/34  brain]
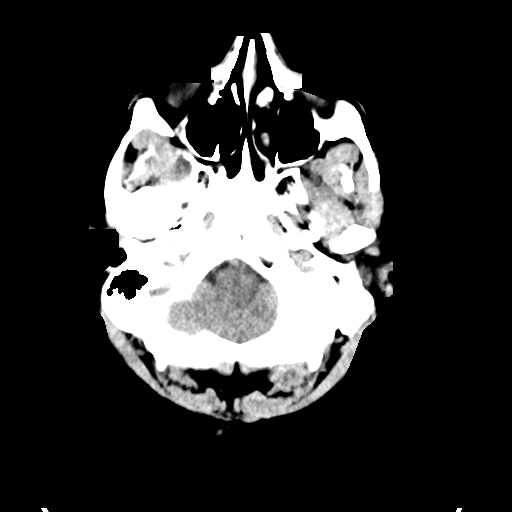
[im 6/34  brain]
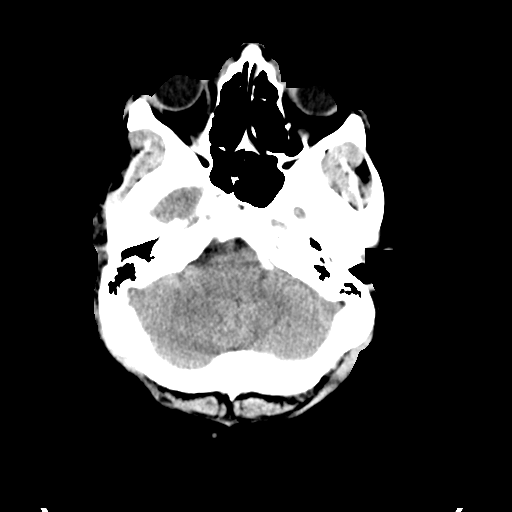
[im 8/34  brain]
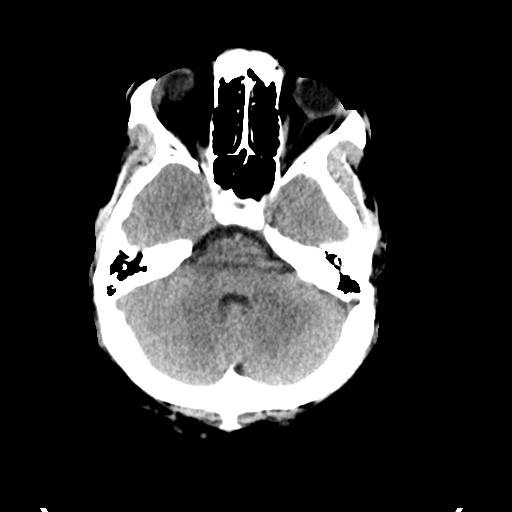
[im 10/34  brain]
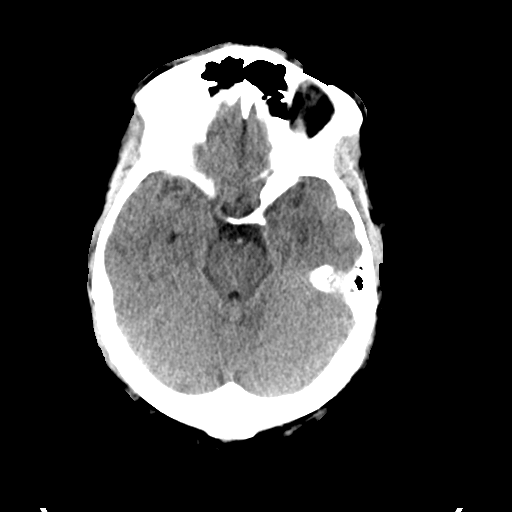
[im 10/34  bone]
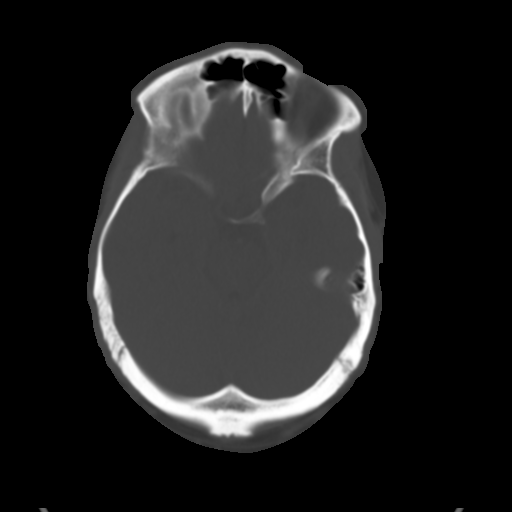
[im 12/34  brain]
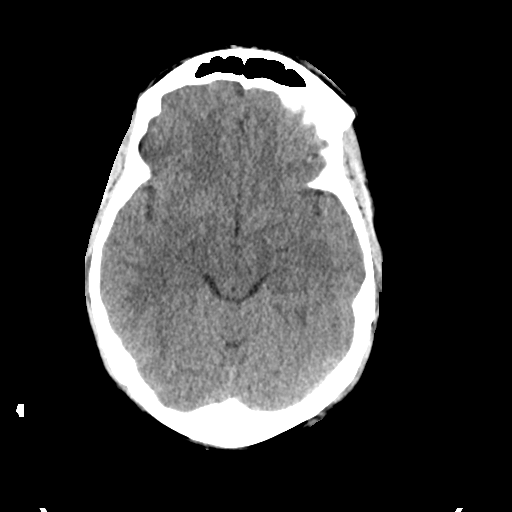
[im 14/34  brain]
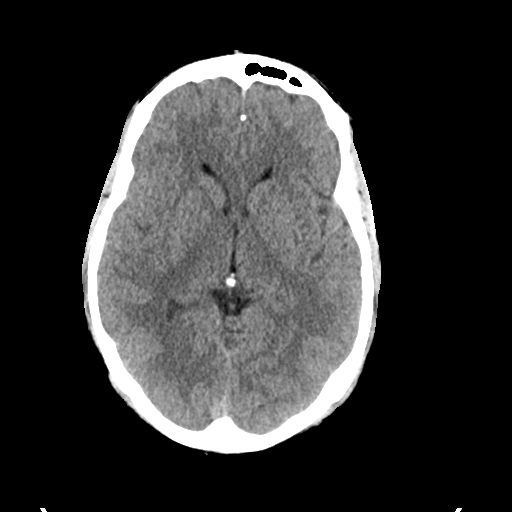
[im 16/34  brain]
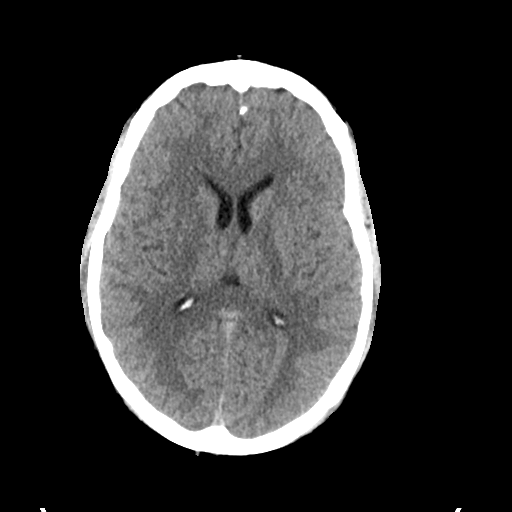
[im 18/34  brain]
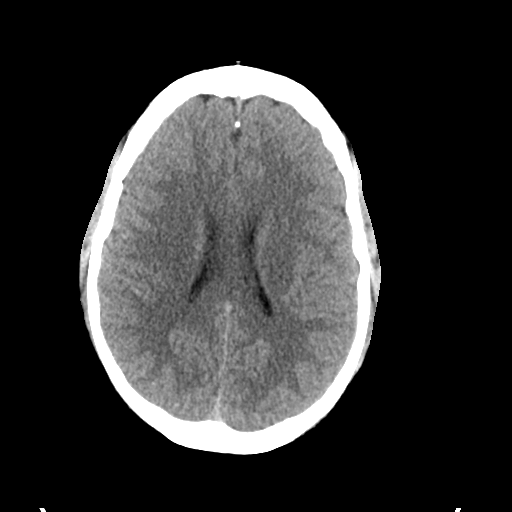
[im 18/34  bone]
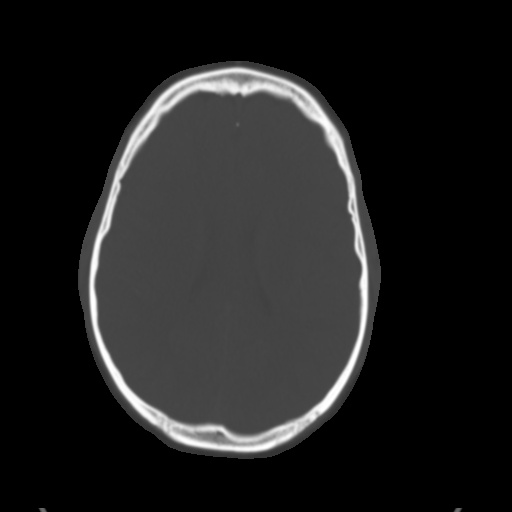
[im 20/34  brain]
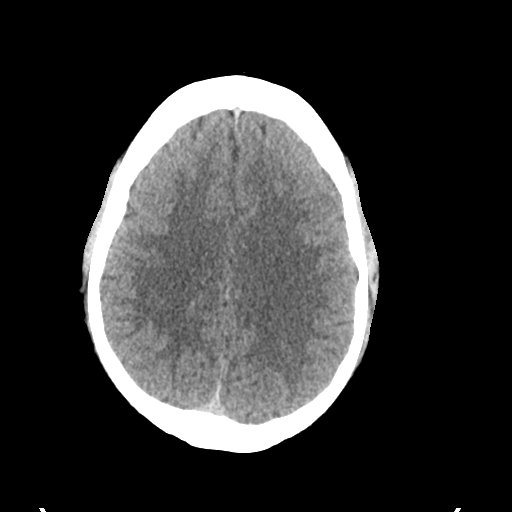
[im 22/34  brain]
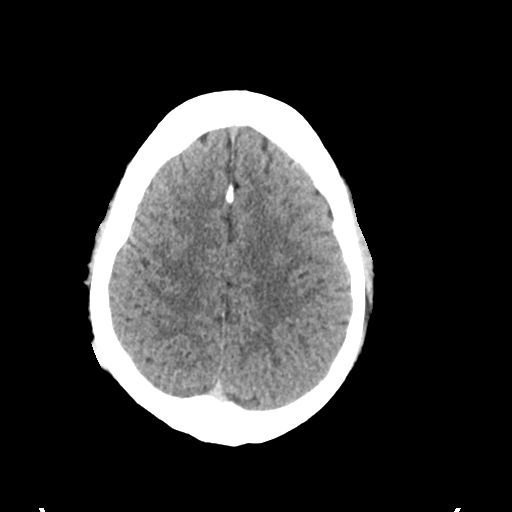
[im 24/34  brain]
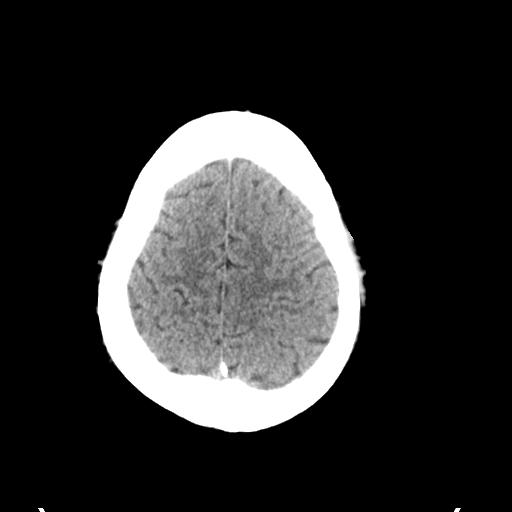
[im 26/34  brain]
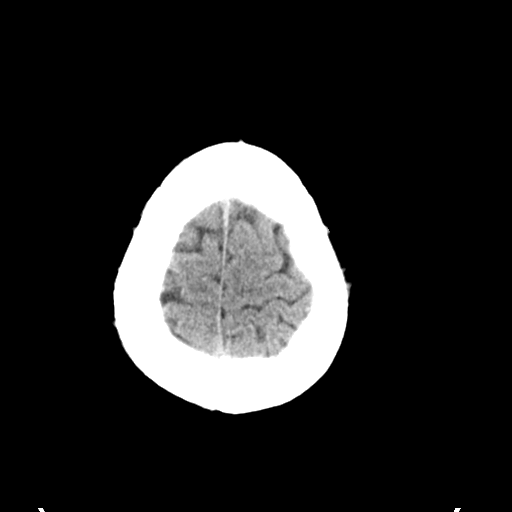
[im 26/34  bone]
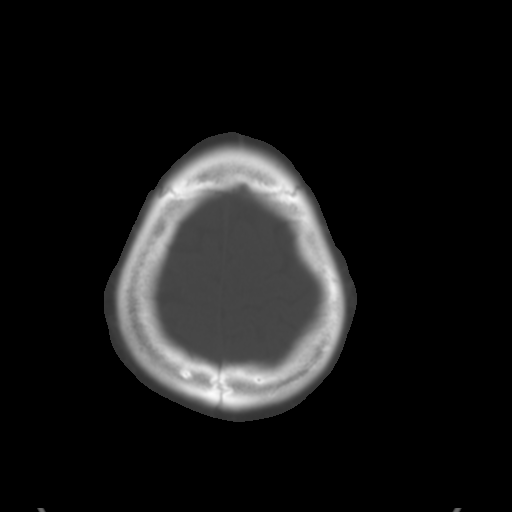
[im 28/34  brain]
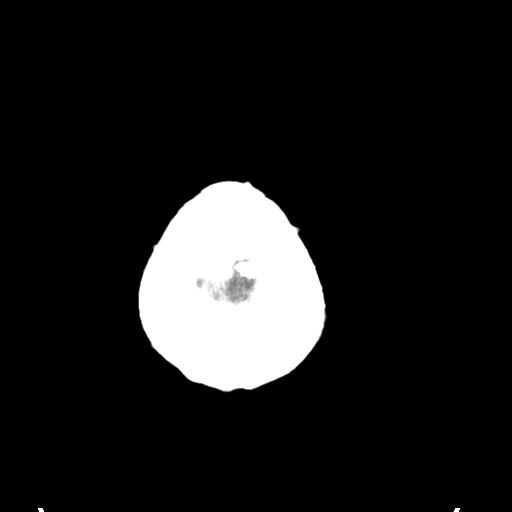
[im 30/34  brain]
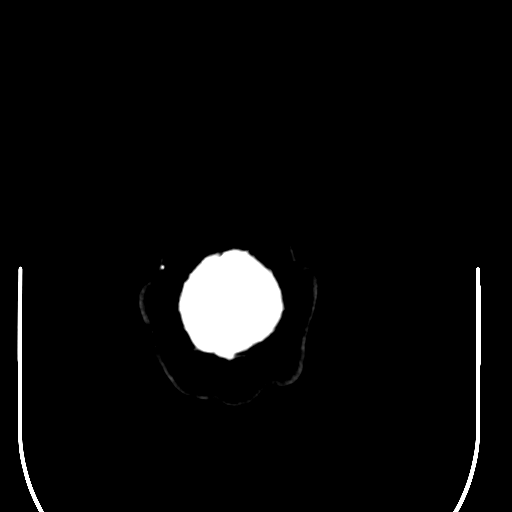
[im 32/34  brain]
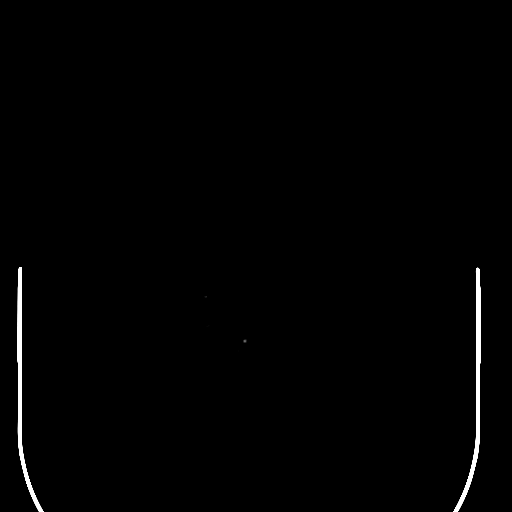

[16 of 30 positions shown; findings below may reference images not displayed]

FINDINGS: No intracranial abnormalities are identified, including mass lesion
or mass effect, hydrocephalus, extra-axial fluid collection, midline
shift, hemorrhage, or acute infarction.

The visualized bony calvarium is unremarkable.
IMPRESSION: Normal noncontrast head CT.
